# Patient Record
Sex: Male | Born: 1980 | Race: White | Hispanic: No | Marital: Married | Smoking: Current every day smoker
Health system: Southern US, Community
[De-identification: ages and names within clinical notes are randomized; demographics above are authoritative.]

## PROBLEM LIST (undated history)

## (undated) DIAGNOSIS — M109 Gout, unspecified: Secondary | ICD-10-CM

## (undated) DIAGNOSIS — K219 Gastro-esophageal reflux disease without esophagitis: Secondary | ICD-10-CM

---
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
## 2002-08-12 ENCOUNTER — Encounter: Payer: Self-pay | Admitting: Emergency Medicine

## 2002-08-12 ENCOUNTER — Emergency Department (HOSPITAL_COMMUNITY): Admission: EM | Admit: 2002-08-12 | Discharge: 2002-08-12 | Payer: Self-pay | Admitting: Emergency Medicine

## 2003-02-21 ENCOUNTER — Emergency Department (HOSPITAL_COMMUNITY): Admission: EM | Admit: 2003-02-21 | Discharge: 2003-02-21 | Payer: Self-pay | Admitting: *Deleted

## 2003-07-18 ENCOUNTER — Emergency Department (HOSPITAL_COMMUNITY): Admission: EM | Admit: 2003-07-18 | Discharge: 2003-07-18 | Payer: Self-pay

## 2006-01-12 ENCOUNTER — Emergency Department (HOSPITAL_COMMUNITY): Admission: EM | Admit: 2006-01-12 | Discharge: 2006-01-12 | Payer: Self-pay | Admitting: Emergency Medicine
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
## 2010-10-25 ENCOUNTER — Ambulatory Visit: Payer: Self-pay | Admitting: Family Medicine

## 2013-07-12 ENCOUNTER — Encounter (HOSPITAL_COMMUNITY): Payer: Self-pay | Admitting: Emergency Medicine

## 2013-07-12 ENCOUNTER — Emergency Department (HOSPITAL_COMMUNITY)
Admission: EM | Admit: 2013-07-12 | Discharge: 2013-07-12 | Disposition: A | Discharge status: Home / Self Care | Payer: 59 | Attending: Emergency Medicine | Admitting: Emergency Medicine

## 2013-07-12 ENCOUNTER — Emergency Department (INDEPENDENT_AMBULATORY_CARE_PROVIDER_SITE_OTHER)
Admission: EM | Admit: 2013-07-12 | Discharge: 2013-07-12 | Disposition: A | Discharge status: Nursing Home (Includes ICF) | Payer: 59 | Source: Home / Self Care | Attending: Family Medicine | Admitting: Family Medicine

## 2013-07-12 ENCOUNTER — Emergency Department (INDEPENDENT_AMBULATORY_CARE_PROVIDER_SITE_OTHER): Payer: 59

## 2013-07-12 ENCOUNTER — Emergency Department (HOSPITAL_COMMUNITY): Payer: 59

## 2013-07-12 DIAGNOSIS — Z8719 Personal history of other diseases of the digestive system: Secondary | ICD-10-CM | POA: Insufficient documentation

## 2013-07-12 DIAGNOSIS — R0609 Other forms of dyspnea: Secondary | ICD-10-CM

## 2013-07-12 DIAGNOSIS — R0600 Dyspnea, unspecified: Secondary | ICD-10-CM

## 2013-07-12 DIAGNOSIS — IMO0002 Reserved for concepts with insufficient information to code with codable children: Secondary | ICD-10-CM | POA: Insufficient documentation

## 2013-07-12 DIAGNOSIS — F17200 Nicotine dependence, unspecified, uncomplicated: Secondary | ICD-10-CM | POA: Insufficient documentation

## 2013-07-12 DIAGNOSIS — R42 Dizziness and giddiness: Secondary | ICD-10-CM | POA: Insufficient documentation

## 2013-07-12 DIAGNOSIS — R0602 Shortness of breath: Secondary | ICD-10-CM

## 2013-07-12 DIAGNOSIS — J45901 Unspecified asthma with (acute) exacerbation: Secondary | ICD-10-CM | POA: Insufficient documentation

## 2013-07-12 HISTORY — DX: Gastro-esophageal reflux disease without esophagitis: K21.9

## 2013-07-12 LAB — CBC WITH DIFFERENTIAL/PLATELET
Basophils Absolute: 0.1 10*3/uL (ref 0.0–0.1)
Basophils Relative: 0 % (ref 0–1)
Eosinophils Absolute: 0.1 10*3/uL (ref 0.0–0.7)
Eosinophils Relative: 1 % (ref 0–5)
HCT: 42.8 % (ref 39.0–52.0)
MCH: 33.9 pg (ref 26.0–34.0)
MCHC: 36.2 g/dL — ABNORMAL HIGH (ref 30.0–36.0)
Monocytes Absolute: 1 10*3/uL (ref 0.1–1.0)
Neutro Abs: 9.1 10*3/uL — ABNORMAL HIGH (ref 1.7–7.7)
RDW: 12.6 % (ref 11.5–15.5)

## 2013-07-12 LAB — COMPREHENSIVE METABOLIC PANEL
ALT: 24 U/L (ref 0–53)
Alkaline Phosphatase: 58 U/L (ref 39–117)
CO2: 25 mEq/L (ref 19–32)
GFR calc Af Amer: >90 mL/min (ref >90)
Glucose, Bld: 104 mg/dL — ABNORMAL HIGH (ref 70–99)
Potassium: 3.9 mEq/L (ref 3.5–5.1)
Sodium: 138 mEq/L (ref 135–145)
Total Protein: 7.5 g/dL (ref 6.0–8.3)

## 2013-07-12 LAB — D-DIMER, QUANTITATIVE: D-Dimer, Quant: <0.27 ug/mL-FEU (ref 0.00–0.48)

## 2013-07-12 MED ORDER — PREDNISONE 20 MG PO TABS: 40.0000 mg | ORAL_TABLET | Freq: Every day | ORAL | Status: DC

## 2013-07-12 MED ORDER — ALBUTEROL SULFATE (5 MG/ML) 0.5% IN NEBU
2.5000 mg | INHALATION_SOLUTION | Freq: Once | RESPIRATORY_TRACT | Status: AC
  Administered 2013-07-12: 2.5 mg via RESPIRATORY_TRACT
  Filled 2013-07-12: qty 0.5

## 2013-07-12 MED ORDER — PREDNISONE 20 MG PO TABS
60.0000 mg | ORAL_TABLET | Freq: Once | ORAL | Status: AC
  Administered 2013-07-12: 60 mg via ORAL
  Filled 2013-07-12: qty 3

## 2013-07-12 MED ORDER — IPRATROPIUM BROMIDE 0.02 % IN SOLN
0.5000 mg | Freq: Once | RESPIRATORY_TRACT | Status: AC
  Administered 2013-07-12: 0.5 mg via RESPIRATORY_TRACT
  Filled 2013-07-12: qty 2.5

## 2013-07-12 MED ORDER — ALBUTEROL SULFATE HFA 108 (90 BASE) MCG/ACT IN AERS
2.0000 | INHALATION_SPRAY | Freq: Once | RESPIRATORY_TRACT | Status: AC
  Administered 2013-07-12: 2 via RESPIRATORY_TRACT
  Filled 2013-07-12: qty 6.7

## 2013-07-12 NOTE — ED Notes (Signed)
Sob and chest pain x 5 hours ago back of neck hurts and and needle feeling in chest

## 2013-07-12 NOTE — ED Notes (Signed)
Patient ambulatory with pulse ox, lowest reading 96%. Tolerated well. Denies dizziness or sob while abulating.

## 2013-07-12 NOTE — ED Provider Notes (Signed)
CSN: [id number]     Arrival date & time 07/12/13  1000 History   First MD Initiated Contact with Patient 07/12/13 1108     Chief Complaint  Patient presents with  . Shortness of Breath  . Chest Pain   (Consider location/radiation/quality/duration/timing/severity/associated sxs/prior Treatment) HPI Comments: Patient presents from urgent care for shortness of breath since this morning which came on gradually and has been constant and persistent. Patient states symptoms have gradually improved spontaneously since onset. Shortness of breath associated with a pain in his chest that is "needle like"; pain was so severe at onset that patient became anxious, causing him to also become lightheaded. Patient experienced further increased work of breathing with pain. He states deep breathing aggravates his symptoms and he denies alleviating factors. Hx of asthma as a child; no hx of intubations or hospitalizations for asthma. Patient denies associated fevers, jaw pain, nausea, vomiting, abdominal pain, syncope, numbness, extremity weakness. Patient further denies a hx of HTN, ACS, PE/DVT, surgeries or hospitalizations in the past month, and HRT as well as any leg swelling, calf tenderness or claudication. Patient is a 1ppd smoker x 15 years.  Patient is a 32 y.o. male presenting with shortness of breath and chest pain.  Shortness of Breath Associated symptoms: chest pain   Associated symptoms: no fever and no vomiting   Chest Pain Associated symptoms: shortness of breath   Associated symptoms: no fever, no nausea and not vomiting     Past Medical History  Diagnosis Date  . Acid reflux    History reviewed. No pertinent past surgical history. No family history on file. History  Substance Use Topics  . Smoking status: Current Every Day Smoker  . Smokeless tobacco: Not on file  . Alcohol Use: Yes    Review of Systems  Constitutional: Negative for fever.  Eyes: Negative for visual disturbance.   Respiratory: Positive for shortness of breath.   Cardiovascular: Positive for chest pain.  Gastrointestinal: Negative for nausea and vomiting.  Neurological: Positive for light-headedness. Negative for syncope.  All other systems reviewed and are negative.    Allergies  Review of patient's allergies indicates no known allergies.  Home Medications   Current Outpatient Rx  Name  Route  Sig  Dispense  Refill  . predniSONE (DELTASONE) 20 MG tablet   Oral   Take 2 tablets (40 mg total) by mouth daily.   10 tablet   0    BP 120/77  Pulse 88  Temp(Src) 97.9 F (36.6 C) (Oral)  Resp 22  SpO2 96%  Physical Exam  Nursing note and vitals reviewed. Constitutional: He is oriented to person, place, and time. He appears well-developed and well-nourished. No distress.  HENT:  Head: Normocephalic and atraumatic.  Mouth/Throat: Oropharynx is clear and moist. No oropharyngeal exudate.  Airway patent and patient tolerating secretions without difficulty  Eyes: Conjunctivae and EOM are normal. Pupils are equal, round, and reactive to light. No scleral icterus.  Neck: Normal range of motion. Neck supple.  No stridor  Cardiovascular: Normal rate, regular rhythm, normal heart sounds and intact distal pulses.   Pulmonary/Chest: Effort normal and breath sounds normal. No stridor. No respiratory distress. He has no wheezes. He has no rales.  No retractions or accessory muscle use  Abdominal: Soft. He exhibits no distension. There is no tenderness. There is no rebound and no guarding.  Musculoskeletal: Normal range of motion.  Lymphadenopathy:    He has no cervical adenopathy.  Neurological: He is alert and  oriented to person, place, and time.  Skin: Skin is warm and dry. No rash noted. He is not diaphoretic. No erythema. No pallor.  Psychiatric: He has a normal mood and affect. His behavior is normal.    ED Course  Procedures (including critical care time) Labs Review Labs Reviewed   COMPREHENSIVE METABOLIC PANEL - Abnormal; Notable for the following:    Glucose, Bld 104 (*)    All other components within normal limits  CBC WITH DIFFERENTIAL - Abnormal; Notable for the following:    WBC 12.4 (*)    MCHC 36.2 (*)    Neutro Abs 9.1 (*)    All other components within normal limits  D-DIMER, QUANTITATIVE  POCT I-STAT TROPONIN I   Imaging Review Dg Chest 2 View  07/12/2013   *RADIOLOGY REPORT*  Clinical Data: Chest pain and shortness of breath  CHEST - 2 VIEW  Comparison:  July 12, 2013  Findings:  Lungs clear.  Heart size and pulmonary vascularity normal.  No adenopathy.  No pneumothorax.  No bone lesions.  IMPRESSION: No abnormality noted.   Original Report Authenticated By: Bretta Bang, M.D.   Dg Chest 2 View  07/12/2013   *RADIOLOGY REPORT*  Clinical Data: Sudden onset shortness of breath.  CHEST - 2 VIEW  Comparison: None.  Findings: The lungs are clear without focal infiltrate, edema, pneumothorax or pleural effusion. Interstitial markings are diffusely coarsened with chronic features. The cardiopericardial silhouette is within normal limits for size. Imaged bony structures of the thorax are intact.  IMPRESSION: No acute cardiopulmonary process.   Original Report Authenticated By: Kennith Center, M.D.    Date: 07/12/2013  Rate: 76  Rhythm: normal sinus rhythm  QRS Axis: normal  Intervals: normal  ST/T Wave abnormalities: nonspecific ST changes  Conduction Disutrbances:none  Narrative Interpretation: NSR with ? early repolarization; no STEMI  Old EKG Reviewed: none available I have personally reviewed and interpreted this EKG  MDM   1. Shortness of breath    32 year old male with no significant past medical history presents for shortness of breath. Patient states the symptoms have gradually improved since onset spontaneously. On initial presentation patient as well and nontoxic appearing, hemodynamically stable and afebrile. Patient is taking even deep  respirations without retractions or accessory muscle use. He is in no acute respiratory distress. Lungs clear to auscultation bilaterally. Chest x-ray without evidence of pneumonia, pneumothorax, pleural effusion, or infiltrate. EKG unremarkable and troponin 0.00 - doubt ACS in light of reassuring work up, atypical nature of symptoms, and TIMI score of 0. Also doubt PE given lack of tachycardia, tachypnea, dyspnea, or hypoxia; D dimer WNL. Patient given duoneb and prednisone in ED with mild relief of symptoms, though he states symptoms have continued to improve spontaneously. Suspect acute bronchospasm vs anxiety vs reflux. Patient ambulates without hypoxia. Believe patient is appropriate and stable for d/c with PCP follow up for further evaluation of symptoms. Rx for prednisone and an albuterol inhaler provided should symptoms persist. Return precautions advised and patient agreeable to plan with no unaddressed concerns.    Antony Madura, PA-C 07/12/13 1430

## 2013-07-12 NOTE — ED Provider Notes (Signed)
Medical screening examination/treatment/procedure(s) were performed by non-physician practitioner and as supervising physician I was immediately available for consultation/collaboration.   Brayant Dorr W. Hollace Michelli, MD 07/12/13 1639 

## 2013-07-12 NOTE — ED Notes (Signed)
Sob onset approximately 5 hours ago.  Reports worse the last 2 hours.  Patient works night shift.  Patient reports heart burn initially, followed by coughing, then sharp pain in chest. Has continued to have sharp pain in chest .  Patient admits to feeling anxious

## 2013-07-12 NOTE — ED Notes (Signed)
Patient to ED with C/O shortness of breath.  States that when he got up this AM he was having difficulty breathing. He denies asthm or other respiratory problems. Patient is a smoker.

## 2013-07-12 NOTE — ED Provider Notes (Addendum)
CSN: [id number]     Arrival date & time 07/12/13  1308 History   First MD Initiated Contact with Patient 07/12/13 [phone]     Chief Complaint  Patient presents with  . Shortness of Breath   (Consider location/radiation/quality/duration/timing/severity/associated sxs/prior Treatment) Patient is a 32 y.o. male presenting with shortness of breath. The history is provided by the patient.  Shortness of Breath Severity:  Moderate Onset quality:  Sudden Duration:  2 hours Timing:  Constant Progression:  Worsening Chronicity:  New Context comment:  Onset with coughing and heartbirn. Associated symptoms: chest pain and cough   Associated symptoms: no abdominal pain, no fever, no hemoptysis, no sputum production and no wheezing   Risk factors: tobacco use     History reviewed. No pertinent past medical history. History reviewed. No pertinent past surgical history. No family history on file. History  Substance Use Topics  . Smoking status: Current Every Day Smoker  . Smokeless tobacco: Not on file  . Alcohol Use: No    Review of Systems  Constitutional: Negative.  Negative for fever.  Respiratory: Positive for cough and shortness of breath. Negative for hemoptysis, sputum production and wheezing.   Cardiovascular: Positive for chest pain. Negative for palpitations and leg swelling.  Gastrointestinal: Negative for abdominal pain.    Allergies  Review of patient's allergies indicates no known allergies.  Home Medications  No current outpatient prescriptions on file. BP 142/91  Pulse 82  Temp(Src) 98.1 F (36.7 C) (Oral)  Resp 28  SpO2 100% Physical Exam  Nursing note and vitals reviewed. Constitutional: He is oriented to person, place, and time. He appears well-developed and well-nourished.  HENT:  Head: Normocephalic.  Right Ear: External ear normal.  Left Ear: External ear normal.  Mouth/Throat: Oropharynx is clear and moist.  Eyes: Pupils are equal, round, and reactive to  light.  Neck: Normal range of motion. Neck supple.  Cardiovascular: Normal rate, regular rhythm, normal heart sounds and intact distal pulses.   Pulmonary/Chest: He is in respiratory distress. He has no wheezes. He has no rales. He exhibits no tenderness.  Abdominal: Soft. Bowel sounds are normal.  Lymphadenopathy:    He has no cervical adenopathy.  Neurological: He is alert and oriented to person, place, and time.  Skin: Skin is warm and dry.    ED Course  Procedures (including critical care time) Labs Review Labs Reviewed - No data to display Imaging Review Dg Chest 2 View  07/12/2013   *RADIOLOGY REPORT*  Clinical Data: Sudden onset shortness of breath.  CHEST - 2 VIEW  Comparison: None.  Findings: The lungs are clear without focal infiltrate, edema, pneumothorax or pleural effusion. Interstitial markings are diffusely coarsened with chronic features. The cardiopericardial silhouette is within normal limits for size. Imaged bony structures of the thorax are intact.  IMPRESSION: No acute cardiopulmonary process.   Original Report Authenticated By: Kennith Center, M.D.    MDM  Sent for eval of sob, r/o PE, cxr neg., sudden onset of dyspnea and sob in male smoker this am.    Linna Hoff, MD 07/12/13 0945  Linna Hoff, MD 07/12/13 [phone]

## 2016-09-27 ENCOUNTER — Emergency Department (HOSPITAL_COMMUNITY)
Admission: EM | Admit: 2016-09-27 | Discharge: 2016-09-27 | Disposition: A | Discharge status: Home / Self Care | Payer: Self-pay | Attending: Emergency Medicine | Admitting: Emergency Medicine

## 2016-09-27 ENCOUNTER — Encounter (HOSPITAL_COMMUNITY): Payer: Self-pay | Admitting: *Deleted

## 2016-09-27 DIAGNOSIS — T23221A Burn of second degree of single right finger (nail) except thumb, initial encounter: Secondary | ICD-10-CM | POA: Insufficient documentation

## 2016-09-27 DIAGNOSIS — F17200 Nicotine dependence, unspecified, uncomplicated: Secondary | ICD-10-CM | POA: Insufficient documentation

## 2016-09-27 DIAGNOSIS — T23211A Burn of second degree of right thumb (nail), initial encounter: Secondary | ICD-10-CM | POA: Insufficient documentation

## 2016-09-27 DIAGNOSIS — T300 Burn of unspecified body region, unspecified degree: Secondary | ICD-10-CM

## 2016-09-27 DIAGNOSIS — Y939 Activity, unspecified: Secondary | ICD-10-CM | POA: Insufficient documentation

## 2016-09-27 DIAGNOSIS — Y9269 Other specified industrial and construction area as the place of occurrence of the external cause: Secondary | ICD-10-CM | POA: Insufficient documentation

## 2016-09-27 DIAGNOSIS — T23251A Burn of second degree of right palm, initial encounter: Secondary | ICD-10-CM | POA: Insufficient documentation

## 2016-09-27 DIAGNOSIS — W868XXA Exposure to other electric current, initial encounter: Secondary | ICD-10-CM | POA: Insufficient documentation

## 2016-09-27 DIAGNOSIS — Y990 Civilian activity done for income or pay: Secondary | ICD-10-CM | POA: Insufficient documentation

## 2016-09-27 LAB — CK: Total CK: 287 U/L (ref 49–397)

## 2016-09-27 MED ORDER — SILVER SULFADIAZINE 1 % EX CREA
TOPICAL_CREAM | Freq: Once | CUTANEOUS | Status: AC
  Administered 2016-09-27: 18:00:00 via TOPICAL
  Filled 2016-09-27: qty 85

## 2016-09-27 MED ORDER — KETOROLAC TROMETHAMINE 30 MG/ML IJ SOLN
30.0000 mg | Freq: Once | INTRAMUSCULAR | Status: AC
  Administered 2016-09-27: 30 mg via INTRAVENOUS
  Filled 2016-09-27: qty 1

## 2016-09-27 MED ORDER — MORPHINE SULFATE (PF) 4 MG/ML IV SOLN
4.0000 mg | Freq: Once | INTRAVENOUS | Status: AC
  Administered 2016-09-27: 4 mg via INTRAVENOUS
  Filled 2016-09-27: qty 1
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
## 2016-09-27 NOTE — ED Notes (Signed)
Wound cleaning completed right hand soap and water then wound cleanser. Patient tolerated without incident.

## 2016-09-27 NOTE — ED Notes (Signed)
Provider at bedside

## 2022-09-20 ENCOUNTER — Other Ambulatory Visit: Payer: Self-pay

## 2022-09-20 ENCOUNTER — Encounter (HOSPITAL_COMMUNITY): Payer: Self-pay

## 2022-09-20 ENCOUNTER — Emergency Department (HOSPITAL_COMMUNITY)
Admission: EM | Admit: 2022-09-20 | Discharge: 2022-09-20 | Disposition: A | Discharge status: Home / Self Care | Payer: Self-pay | Attending: Emergency Medicine | Admitting: Emergency Medicine

## 2022-09-20 DIAGNOSIS — R197 Diarrhea, unspecified: Secondary | ICD-10-CM | POA: Insufficient documentation

## 2022-09-20 DIAGNOSIS — R1114 Bilious vomiting: Secondary | ICD-10-CM | POA: Insufficient documentation

## 2022-09-20 DIAGNOSIS — R42 Dizziness and giddiness: Secondary | ICD-10-CM | POA: Insufficient documentation

## 2022-09-20 DIAGNOSIS — R109 Unspecified abdominal pain: Secondary | ICD-10-CM | POA: Insufficient documentation

## 2022-09-20 HISTORY — DX: Gout, unspecified: M10.9

## 2022-09-20 LAB — CBC WITH DIFFERENTIAL/PLATELET
Abs Immature Granulocytes: 0.05 10*3/uL (ref 0.00–0.07)
Basophils Absolute: 0 10*3/uL (ref 0.0–0.1)
Basophils Relative: 0 %
Eosinophils Absolute: 0 10*3/uL (ref 0.0–0.5)
Eosinophils Relative: 0 %
HCT: 49 % (ref 39.0–52.0)
Hemoglobin: 16.8 g/dL (ref 13.0–17.0)
Immature Granulocytes: 0 %
Lymphocytes Relative: 13 %
Lymphs Abs: 1.5 10*3/uL (ref 0.7–4.0)
MCH: 30.5 pg (ref 26.0–34.0)
MCHC: 34.3 g/dL (ref 30.0–36.0)
MCV: 89.1 fL (ref 80.0–100.0)
Monocytes Absolute: 1.1 10*3/uL — ABNORMAL HIGH (ref 0.1–1.0)
Monocytes Relative: 10 %
Neutro Abs: 8.8 10*3/uL — ABNORMAL HIGH (ref 1.7–7.7)
Neutrophils Relative %: 77 %
Platelets: 322 10*3/uL (ref 150–400)
RBC: 5.5 MIL/uL (ref 4.22–5.81)
RDW: 12.3 % (ref 11.5–15.5)
WBC: 11.5 10*3/uL — ABNORMAL HIGH (ref 4.0–10.5)
nRBC: 0 % (ref 0.0–0.2)

## 2022-09-20 LAB — CK: Total CK: 221 U/L (ref 49–397)

## 2022-09-20 LAB — URINALYSIS, ROUTINE W REFLEX MICROSCOPIC
Bacteria, UA: NONE SEEN
Bilirubin Urine: NEGATIVE
Glucose, UA: NEGATIVE mg/dL
Hgb urine dipstick: NEGATIVE
Ketones, ur: NEGATIVE mg/dL
Leukocytes,Ua: NEGATIVE
Nitrite: NEGATIVE
Protein, ur: 30 mg/dL — AB
Specific Gravity, Urine: 1.027 (ref 1.005–1.030)
pH: 7 (ref 5.0–8.0)

## 2022-09-20 LAB — MAGNESIUM: Magnesium: 2.4 mg/dL (ref 1.7–2.4)

## 2022-09-20 LAB — COMPREHENSIVE METABOLIC PANEL
ALT: 42 U/L (ref 0–44)
AST: 40 U/L (ref 15–41)
Albumin: 4.5 g/dL (ref 3.5–5.0)
Alkaline Phosphatase: 67 U/L (ref 38–126)
Anion gap: 17 — ABNORMAL HIGH (ref 5–15)
BUN: 22 mg/dL — ABNORMAL HIGH (ref 6–20)
CO2: 28 mmol/L (ref 22–32)
Calcium: 9.9 mg/dL (ref 8.9–10.3)
Chloride: 91 mmol/L — ABNORMAL LOW (ref 98–111)
Creatinine, Ser: 1.08 mg/dL (ref 0.61–1.24)
GFR, Estimated: >60 mL/min (ref >60)
Glucose, Bld: 124 mg/dL — ABNORMAL HIGH (ref 70–99)
Potassium: 2.8 mmol/L — ABNORMAL LOW (ref 3.5–5.1)
Sodium: 136 mmol/L (ref 135–145)
Total Bilirubin: 0.6 mg/dL (ref 0.3–1.2)
Total Protein: 8.3 g/dL — ABNORMAL HIGH (ref 6.5–8.1)

## 2022-09-20 LAB — LIPASE, BLOOD: Lipase: 41 U/L (ref 11–51)

## 2022-09-20 MED ORDER — LACTATED RINGERS IV BOLUS
1000.0000 mL | Freq: Once | INTRAVENOUS | Status: AC
  Administered 2022-09-20: 1000 mL via INTRAVENOUS

## 2022-09-20 MED ORDER — POTASSIUM CHLORIDE 10 MEQ/100ML IV SOLN
10.0000 meq | Freq: Once | INTRAVENOUS | Status: AC
  Administered 2022-09-20: 10 meq via INTRAVENOUS
  Filled 2022-09-20: qty 100

## 2022-09-20 MED ORDER — SODIUM CHLORIDE 0.9 % IV BOLUS: 1000.0000 mL | Freq: Once | INTRAVENOUS | Status: DC

## 2022-09-20 MED ORDER — KETOROLAC TROMETHAMINE 15 MG/ML IJ SOLN: 15.0000 mg | Freq: Once | INTRAMUSCULAR | Status: DC

## 2022-09-20 MED ORDER — ONDANSETRON 4 MG PO TBDP: 4.0000 mg | ORAL_TABLET | Freq: Three times a day (TID) | ORAL | 0 refills | Status: DC | PRN

## 2022-09-20 MED ORDER — BUPRENORPHINE HCL-NALOXONE HCL 8-2 MG SL FILM: 1.0000 | ORAL_FILM | Freq: Every day | SUBLINGUAL | 0 refills | Status: AC

## 2022-09-20 MED ORDER — METOCLOPRAMIDE HCL 5 MG/ML IJ SOLN: 10.0000 mg | Freq: Once | INTRAMUSCULAR | Status: DC

## 2022-09-20 MED ORDER — KETOROLAC TROMETHAMINE 15 MG/ML IJ SOLN
15.0000 mg | Freq: Once | INTRAMUSCULAR | Status: AC
  Administered 2022-09-20: 15 mg via INTRAVENOUS
  Filled 2022-09-20: qty 1

## 2022-09-20 MED ORDER — METOCLOPRAMIDE HCL 5 MG/ML IJ SOLN
10.0000 mg | Freq: Once | INTRAMUSCULAR | Status: AC
  Administered 2022-09-20: 10 mg via INTRAVENOUS
  Filled 2022-09-20: qty 2

## 2022-09-20 NOTE — ED Provider Triage Note (Signed)
Emergency Medicine Provider Triage Evaluation Note  Zachary Sullivan , a 41 y.o. male  was evaluated in triage.  Pt complains of nausea, vomiting x 4 days due to withdrawal. Symptoms associated with body aches, lightheadedness, headache. Had a syncopal event today, but denies worsening headache since the fall/syncope. 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  Prison reported starting meclizine and PO fluids today for withdrawal.   Review of Systems  Positive: As above Negative: As above  Physical Exam  There were no vitals taken for this visit. Gen:   Awake, ill appearing Resp:  Normal effort. Lungs CTAB. MSK:   Moves extremities without difficulty  Other:  Cheilitis  Medical Decision Making  Medically screening exam initiated at 1:16 AM.  Appropriate orders placed.  Darnelle Catalan was informed that the remainder of the evaluation will be completed by another provider, this initial triage assessment does not replace that evaluation, and the importance of remaining in the ED until their evaluation is complete.  Opiate withdrawal with N/V - work up initiated   Antony Madura, PA-C 09/20/22 0126

## 2022-09-20 NOTE — ED Notes (Signed)
Attempted IV with no success. Pt has hx of IV drug use. IV team consult placed. Pending labs and meds.

## 2022-09-20 NOTE — ED Notes (Signed)
Attempt two time for blood draw with no success RN aware.

## 2022-09-20 NOTE — ED Notes (Addendum)
[facility]. Pt has had syncopal episode and vomiting. Pt recently started on protocol for withdrawals that included meclizine.   Pt c/o dizziness, nausea, vomiting and pain all over.   EMS VS 90/ 150/102 HR 88 98% RA Cbg=143

## 2022-09-20 NOTE — ED Provider Notes (Signed)
8:12 PM  Sleeping, no vomiting, hemodynamically unremarkable, urinalysis unremarkable, patient discharged to jail.   Gerhard Munch, MD 09/20/22 2012

## 2022-09-20 NOTE — ED Provider Notes (Signed)
[facility] EMERGENCY DEPARTMENT Provider Note   CSN: [id number] Arrival date & time: 09/20/22  0126     History  Chief Complaint  Patient presents with   Emesis    Zachary Sullivan is a 41 y.o. male.   Emesis Patient presents with nausea vomiting diarrhea.  Has had for the last few days.  Dizziness.  Pain all over.  Has history of heroin use.  Use around 4 days ago.  Is in jail.  Has been on clindamycin and Augmentin for right foot infection.  Reportedly had CT scan done but not have access to this.  Patient states the foot is healing up.  No blood in the emesis.  Has had some abdominal pain.  Blood pressure was low for EMS.  Sent in for difficulty with orals.    Past Medical History:  Diagnosis Date   Acid reflux    Gout   Heroin use  Home Medications Prior to Admission medications   Medication Sig Start Date End Date Taking? Authorizing Provider  Buprenorphine HCl-Naloxone HCl (SUBOXONE) 8-2 MG FILM Place 1 Film under the tongue daily for 5 days. 09/20/22 09/25/22 Yes Gerhard Munch, MD  ondansetron (ZOFRAN-ODT) 4 MG disintegrating tablet Take 1 tablet (4 mg total) by mouth every 8 (eight) hours as needed for nausea or vomiting. 09/20/22  Yes Gerhard Munch, MD  ibuprofen (ADVIL,MOTRIN) 200 MG tablet Take 400 mg by mouth every 6 (six) hours as needed.    [provider]      Allergies    Patient has no known allergies.    Review of Systems   Review of Systems  Gastrointestinal:  Positive for vomiting.    Physical Exam Updated Vital Signs BP (!) 137/97   Pulse (!) 55   Temp 98.6 F (37 C) (Oral)   Resp (!) 30   Ht 6' (1.829 m)   Wt 81.6 kg   SpO2 99%   BMI 24.41 kg/m  Physical Exam Vitals and nursing note reviewed.  HENT:     Head: Normocephalic and atraumatic.  Eyes:     Pupils: Pupils are equal, round, and reactive to light.  Cardiovascular:     Rate and Rhythm: Regular rhythm.  Pulmonary:     Breath sounds: No  wheezing.  Abdominal:     Tenderness: There is no abdominal tenderness.  Musculoskeletal:     Cervical back: Neck supple.  Skin:    General: Skin is warm.     Capillary Refill: Capillary refill takes less than 2 seconds.     Comments: Healing wound right medial ankle without erythema.  Neurological:     Mental Status: He is alert and oriented to person, place, and time.     ED Results / Procedures / Treatments   Labs (all labs ordered are listed, but only abnormal results are displayed) Labs Reviewed  CBC WITH DIFFERENTIAL/PLATELET - Abnormal; Notable for the following components:      Result Value   WBC 11.5 (*)    Neutro Abs 8.8 (*)    Monocytes Absolute 1.1 (*)    All other components within normal limits  COMPREHENSIVE METABOLIC PANEL - Abnormal; Notable for the following components:   Potassium 2.8 (*)    Chloride 91 (*)    Glucose, Bld 124 (*)    BUN 22 (*)    Total Protein 8.3 (*)    Anion gap 17 (*)    All other components within normal limits  URINALYSIS, ROUTINE  W REFLEX MICROSCOPIC - Abnormal; Notable for the following components:   Protein, ur 30 (*)    All other components within normal limits  LIPASE, BLOOD  CK  MAGNESIUM    EKG None  Radiology No results found.  Procedures Procedures    Medications Ordered in ED Medications  lactated ringers bolus 1,000 mL (0 mLs Intravenous Stopped 09/20/22 1614)  potassium chloride 10 mEq in 100 mL IVPB (0 mEq Intravenous Stopped 09/20/22 1614)  ketorolac (TORADOL) 15 MG/ML injection 15 mg (15 mg Intravenous Given 09/20/22 1501)  metoCLOPramide (REGLAN) injection 10 mg (10 mg Intravenous Given 09/20/22 1504)  lactated ringers bolus 1,000 mL (0 mLs Intravenous Stopped 09/20/22 1752)    ED Course/ Medical Decision Making/ A&P                           Medical Decision Making Amount and/or Complexity of Data Reviewed Labs: ordered.  Risk Prescription drug management.   Patient presents with nausea  vomiting diarrhea.  Had a syncopal episode.  Has been treated for opiate withdrawal.  Last use around 4 days ago.  Decreased oral intake.  Feeling bad.  States he feels very dehydrated.  Healing wounds and is on antibiotics.  initial tachycardia initial tachycardia.  We will give fluid bolus..  Does have a hypokalemia.  Will supplement.  I think most likely secondary to withdrawal.  Benign exam.  If tolerates well and tolerates orals hopefully should be able to [facility] back to [facility].       Final Clinical Impression(s) / ED Diagnoses Final diagnoses:  Bilious vomiting with nausea    Rx / DC Orders ED Discharge Orders          Ordered    ondansetron (ZOFRAN-ODT) 4 MG disintegrating tablet  Every 8 hours PRN        09/20/22 2012    Buprenorphine HCl-Naloxone HCl (SUBOXONE) 8-2 MG FILM  Daily        09/20/22 2030              Benjiman Core, MD 09/24/22 1447

## 2022-09-20 NOTE — Discharge Instructions (Addendum)
Be sure to take all of your medication as previously prescribed and use the provided Zofran for additional relief from nausea.  Return here for concerning changes in your condition.

## 2022-09-20 NOTE — ED Notes (Signed)
Pt called multiple times for recheck vitals, no answer, not seen in lobby

## 2023-04-11 ENCOUNTER — Emergency Department (HOSPITAL_COMMUNITY)

## 2023-04-11 ENCOUNTER — Encounter (HOSPITAL_COMMUNITY): Payer: Self-pay

## 2023-04-11 ENCOUNTER — Other Ambulatory Visit: Payer: Self-pay

## 2023-04-11 ENCOUNTER — Inpatient Hospital Stay (HOSPITAL_COMMUNITY)
Admission: EM | Admit: 2023-04-11 | Discharge: 2023-04-18 | DRG: 603 | Attending: Internal Medicine | Admitting: Internal Medicine

## 2023-04-11 DIAGNOSIS — R519 Headache, unspecified: Secondary | ICD-10-CM | POA: Diagnosis present

## 2023-04-11 DIAGNOSIS — F17210 Nicotine dependence, cigarettes, uncomplicated: Secondary | ICD-10-CM | POA: Diagnosis present

## 2023-04-11 DIAGNOSIS — H538 Other visual disturbances: Secondary | ICD-10-CM | POA: Diagnosis present

## 2023-04-11 DIAGNOSIS — K219 Gastro-esophageal reflux disease without esophagitis: Secondary | ICD-10-CM | POA: Diagnosis present

## 2023-04-11 DIAGNOSIS — Z23 Encounter for immunization: Secondary | ICD-10-CM | POA: Diagnosis not present

## 2023-04-11 DIAGNOSIS — L03213 Periorbital cellulitis: Principal | ICD-10-CM | POA: Diagnosis present

## 2023-04-11 DIAGNOSIS — H5711 Ocular pain, right eye: Secondary | ICD-10-CM | POA: Diagnosis present

## 2023-04-11 LAB — CBC WITH DIFFERENTIAL/PLATELET
Abs Immature Granulocytes: 0.02 10*3/uL (ref 0.00–0.07)
Basophils Absolute: 0.1 10*3/uL (ref 0.0–0.1)
Basophils Relative: 1 %
Eosinophils Absolute: 0.2 10*3/uL (ref 0.0–0.5)
Eosinophils Relative: 3 %
HCT: 44.7 % (ref 39.0–52.0)
Hemoglobin: 15 g/dL (ref 13.0–17.0)
Immature Granulocytes: 0 %
Lymphocytes Relative: 23 %
Lymphs Abs: 1.8 10*3/uL (ref 0.7–4.0)
MCH: 31.5 pg (ref 26.0–34.0)
MCHC: 33.6 g/dL (ref 30.0–36.0)
MCV: 93.9 fL (ref 80.0–100.0)
Monocytes Absolute: 0.6 10*3/uL (ref 0.1–1.0)
Monocytes Relative: 8 %
Neutro Abs: 5.2 10*3/uL (ref 1.7–7.7)
Neutrophils Relative %: 65 %
Platelets: 249 10*3/uL (ref 150–400)
RBC: 4.76 MIL/uL (ref 4.22–5.81)
RDW: 13.2 % (ref 11.5–15.5)
WBC: 8 10*3/uL (ref 4.0–10.5)
nRBC: 0 % (ref 0.0–0.2)

## 2023-04-11 LAB — BASIC METABOLIC PANEL
Anion gap: 11 (ref 5–15)
BUN: 16 mg/dL (ref 6–20)
CO2: 26 mmol/L (ref 22–32)
Calcium: 9.4 mg/dL (ref 8.9–10.3)
Chloride: 102 mmol/L (ref 98–111)
Creatinine, Ser: 0.8 mg/dL (ref 0.61–1.24)
GFR, Estimated: >60 mL/min (ref >60)
Glucose, Bld: 92 mg/dL (ref 70–99)
Potassium: 3.7 mmol/L (ref 3.5–5.1)
Sodium: 139 mmol/L (ref 135–145)

## 2023-04-11 MED ORDER — VANCOMYCIN HCL 1750 MG/350ML IV SOLN
1750.0000 mg | Freq: Once | INTRAVENOUS | Status: AC
  Administered 2023-04-11: 1750 mg via INTRAVENOUS
  Filled 2023-04-11: qty 350

## 2023-04-11 MED ORDER — KETOROLAC TROMETHAMINE 15 MG/ML IJ SOLN
15.0000 mg | Freq: Four times a day (QID) | INTRAMUSCULAR | Status: AC | PRN
  Administered 2023-04-11 – 2023-04-14 (×10): 15 mg via INTRAVENOUS
  Filled 2023-04-11 (×10): qty 1

## 2023-04-11 MED ORDER — ENOXAPARIN SODIUM 40 MG/0.4ML IJ SOSY
40.0000 mg | PREFILLED_SYRINGE | INTRAMUSCULAR | Status: DC
  Administered 2023-04-11 – 2023-04-17 (×7): 40 mg via SUBCUTANEOUS
  Filled 2023-04-11 (×7): qty 0.4

## 2023-04-11 MED ORDER — PIPERACILLIN-TAZOBACTAM 3.375 G IVPB 30 MIN
3.3750 g | Freq: Once | INTRAVENOUS | Status: AC
  Administered 2023-04-11: 3.375 g via INTRAVENOUS
  Filled 2023-04-11 (×2): qty 50

## 2023-04-11 MED ORDER — PIPERACILLIN-TAZOBACTAM 3.375 G IVPB
3.3750 g | Freq: Three times a day (TID) | INTRAVENOUS | Status: DC
  Administered 2023-04-12 – 2023-04-18 (×19): 3.375 g via INTRAVENOUS
  Filled 2023-04-11 (×19): qty 50

## 2023-04-11 MED ORDER — ONDANSETRON HCL 4 MG PO TABS: 4.0000 mg | ORAL_TABLET | Freq: Four times a day (QID) | ORAL | Status: DC | PRN

## 2023-04-11 MED ORDER — TETANUS-DIPHTH-ACELL PERTUSSIS 5-2.5-18.5 LF-MCG/0.5 IM SUSY
0.5000 mL | PREFILLED_SYRINGE | Freq: Once | INTRAMUSCULAR | Status: AC
  Administered 2023-04-11: 0.5 mL via INTRAMUSCULAR
  Filled 2023-04-11: qty 0.5

## 2023-04-11 MED ORDER — IOHEXOL 300 MG/ML  SOLN
75.0000 mL | Freq: Once | INTRAMUSCULAR | Status: AC | PRN
  Administered 2023-04-11: 75 mL via INTRAVENOUS

## 2023-04-11 MED ORDER — VANCOMYCIN HCL 1750 MG/350ML IV SOLN
1750.0000 mg | Freq: Two times a day (BID) | INTRAVENOUS | Status: DC
  Administered 2023-04-12: 1750 mg via INTRAVENOUS
  Filled 2023-04-11: qty 350

## 2023-04-11 MED ORDER — PIPERACILLIN-TAZOBACTAM 3.375 G IVPB: 3.3750 g | Freq: Three times a day (TID) | INTRAVENOUS | Status: DC

## 2023-04-11 MED ORDER — ONDANSETRON HCL 4 MG/2ML IJ SOLN: 4.0000 mg | Freq: Four times a day (QID) | INTRAMUSCULAR | Status: DC | PRN

## 2023-04-11 MED ORDER — SODIUM CHLORIDE 0.9 % IV SOLN: INTRAVENOUS | Status: DC

## 2023-04-11 NOTE — ED Provider Notes (Signed)
[place] [facility] AT [facility] Provider Note   CSN: [id number] Arrival date & time: 04/11/23  1546     History  Chief Complaint  Patient presents with   Facial Swelling    right eye pain/swelling that started friday    MCADOO LIVERPOOL is a 42 y.o. male who presents today left facial swelling for 4 days. Patient states he woke up with facial pain that progressively started to swell. Patient believes it was a spider bite, but did not see a spider in his cell. Patient endorses headache, pain with right eye movement, chills, watery eyes, and right unilateral blurry vision. Patient states he has a "high pain tolerance" and rates his HA and face pain a 2/10. Patient states he noticed "clear liquid" from the site yesterday. Patient denies body aches, sore throat, ear pain, N/V, SOB. Patient states he is unsure of his tetanus status. No antibiotics. States unable to get jail staff to bring him to ED for several days until getting his attorney involved and is concerned about receiving medical treatment if returned to facility. 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      Home Medications Prior to Admission medications   Medication Sig Start Date End Date Taking? Authorizing Provider  ibuprofen (ADVIL,MOTRIN) 200 MG tablet Take 400 mg by mouth every 6 (six) hours as needed.    [provider]  ondansetron (ZOFRAN-ODT) 4 MG disintegrating tablet Take 1 tablet (4 mg total) by mouth every 8 (eight) hours as needed for nausea or vomiting. 09/20/22   Gerhard Munch, MD      Allergies    Patient has no known allergies.    Review of Systems   Review of Systems  All other systems reviewed and are negative.   Physical Exam Updated Vital Signs BP (!) 134/92   Pulse 80   Temp 98 F (36.7 C) (Oral)   Resp 16   Ht 5\' 11"  (1.803 m)   Wt 81.6 kg   SpO2 98%   BMI 25.09 kg/m  Physical Exam Vitals and nursing note reviewed.  Constitutional:      General: He is  not in acute distress.    Appearance: Normal appearance. He is not toxic-appearing.  HENT:     Head:     Comments: Right cheek with pinpoint area of fluctuance, no active drainage, surrounding erythema and induration up to right lower eyelid which is mildly swollen, patient endorses pain with extraocular movements    Mouth/Throat:     Mouth: Mucous membranes are moist.  Eyes:     Extraocular Movements: Extraocular movements intact.     Conjunctiva/sclera: Conjunctivae normal.     Pupils: Pupils are equal, round, and reactive to light.  Cardiovascular:     Rate and Rhythm: Normal rate and regular rhythm.     Heart sounds: No murmur heard. Pulmonary:     Effort: Pulmonary effort is normal.     Breath sounds: Normal breath sounds.  Abdominal:     General: Abdomen is flat.     Palpations: Abdomen is soft.     Tenderness: There is no abdominal tenderness.  Musculoskeletal:        General: Normal range of motion.     Cervical back: Normal range of motion and neck supple. No rigidity.     Right lower leg: No edema.     Left lower leg: No edema.  Skin:    General: Skin is warm and dry.  Capillary Refill: Capillary refill takes less than 2 seconds.  Neurological:     General: No focal deficit present.     Mental Status: He is alert and oriented to person, place, and time.  Psychiatric:        Behavior: Behavior normal.     ED Results / Procedures / Treatments   Labs (all labs ordered are listed, but only abnormal results are displayed) Labs Reviewed  CBC WITH DIFFERENTIAL/PLATELET  BASIC METABOLIC PANEL    EKG None  Radiology CT Maxillofacial W Contrast  Result Date: 04/11/2023 CLINICAL DATA:  right facial celluitis extending to eye EXAM: CT MAXILLOFACIAL WITH CONTRAST TECHNIQUE: Multidetector CT imaging of the maxillofacial structures was performed with intravenous contrast. Multiplanar CT image reconstructions were also generated. RADIATION DOSE REDUCTION: This exam was  performed according to the departmental dose-optimization program which includes automated exposure control, adjustment of the mA and/or kV according to patient size and/or use of iterative reconstruction technique. CONTRAST:  75mL OMNIPAQUE IOHEXOL 300 MG/ML  SOLN COMPARISON:  None Available. FINDINGS: Osseous: No fracture or mandibular dislocation. No destructive process. Orbits: Negative. No traumatic or inflammatory finding. Sinuses: No middle ear or mastoid effusion. Impacted cerumen in the left EAC. Paranasal sinuses are clear. Soft tissues: Soft tissue stranding in the infraorbital and pre maxillary soft tissues on the right. No evidence of a drainable fluid collection. There is a small hypodense region in the subcutaneous soft tissues measuring approximately 4 x 6 mm, which could represent a small abscess (series 3, image 61). Limited intracranial: No significant or unexpected finding. IMPRESSION: Soft tissue stranding in the infraorbital and pre maxillary soft tissues on the right, consistent with preseptal cellulitis. There is a small hypodense region in the subcutaneous soft tissues measuring approximately 4 x 6 mm, which could represent a small abscess. No evidence of postseptal inflammatory changes to suggest orbital cellulitis. Electronically Signed   By: Lorenza Cambridge M.D.   On: 04/11/2023 19:33    Procedures Procedures    Medications Ordered in ED Medications  Tdap (BOOSTRIX) injection 0.5 mL (0.5 mLs Intramuscular Given 04/11/23 1759)  iohexol (OMNIPAQUE) 300 MG/ML solution 75 mL (75 mLs Intravenous Contrast Given 04/11/23 1845)    ED Course/ Medical Decision Making/ A&P                             Medical Decision Making Amount and/or Complexity of Data Reviewed Labs: ordered. Decision-making details documented in ED Course. Radiology: ordered. Decision-making details documented in ED Course.  Risk Decision regarding hospitalization.   Medical Decision Making:   RONN OSANTOWSKI is a 42 y.o. male who presented to the ED today with insect bite detailed above.    Patient's presentation is complicated by their history of currently in jail, IVDU.  Complete initial physical exam performed, notably the patient was in NAD. Nontoxic appearing. Right cheek with pinpoint area of fluctuance, no active drainage, surrounding erythema and induration up to right lower eyelid which is mildly swollen, patient endorses pain with extraocular movements.    Reviewed and confirmed nursing documentation for past medical history, family history, social history.    Initial Assessment:   With the patient's presentation, differential diagnosis includes but is not limited to insect/spider bite, cellulitis, abscess, sepsis, preseptal vs orbital cellulitis.  This is most consistent with an acute complicated illness  Initial Plan:  Screening labs including CBC and Metabolic panel to evaluate for infectious or metabolic etiology  of disease.  Tdap updated CT MXF to assess for preseptal/orbital cellulitis Objective evaluation as below reviewed   Initial Study Results:   Laboratory  All laboratory results reviewed without evidence of clinically relevant pathology.    Radiology:  All images reviewed independently. Agree with radiology report at this time.   CT Maxillofacial W Contrast  Result Date: 04/11/2023 CLINICAL DATA:  right facial celluitis extending to eye EXAM: CT MAXILLOFACIAL WITH CONTRAST TECHNIQUE: Multidetector CT imaging of the maxillofacial structures was performed with intravenous contrast. Multiplanar CT image reconstructions were also generated. RADIATION DOSE REDUCTION: This exam was performed according to the departmental dose-optimization program which includes automated exposure control, adjustment of the mA and/or kV according to patient size and/or use of iterative reconstruction technique. CONTRAST:  75mL OMNIPAQUE IOHEXOL 300 MG/ML  SOLN COMPARISON:  None Available.  FINDINGS: Osseous: No fracture or mandibular dislocation. No destructive process. Orbits: Negative. No traumatic or inflammatory finding. Sinuses: No middle ear or mastoid effusion. Impacted cerumen in the left EAC. Paranasal sinuses are clear. Soft tissues: Soft tissue stranding in the infraorbital and pre maxillary soft tissues on the right. No evidence of a drainable fluid collection. There is a small hypodense region in the subcutaneous soft tissues measuring approximately 4 x 6 mm, which could represent a small abscess (series 3, image 61). Limited intracranial: No significant or unexpected finding. IMPRESSION: Soft tissue stranding in the infraorbital and pre maxillary soft tissues on the right, consistent with preseptal cellulitis. There is a small hypodense region in the subcutaneous soft tissues measuring approximately 4 x 6 mm, which could represent a small abscess. No evidence of postseptal inflammatory changes to suggest orbital cellulitis. Electronically Signed   By: Lorenza Cambridge M.D.   On: 04/11/2023 19:33      Final Assessment and Plan:   42 year old male presents to the ED with concern for insect bite to the right cheek.  States that symptoms have been ongoing for several days and unable to get child staff to bring him for evaluation.  No recent antibiotics.  History of IV drug use with last use in February 2024.  Tetanus updated today.  Workup initiated as above for further assessment.  On exam, patient does have a pinpoint area of fluctuance to the right cheek but does have erythematous changes extending up to the right lower eyelid with swelling.  Endorses pain with extraocular movements and slightly blurred vision on the right that seems to be improving.  Neurologically intact.  CT with evidence of preseptal cellulitis.  Discussed with patient and guardian at bedside who stated that it is unlikely patient will get good care at the jail including timely antibiotics and reevaluation as needed.   With this, consulted hospitalist for admission.  Reassuring lab work.  Patient afebrile and nontoxic-appearing.  With anticipated poor outpatient care, will admit for further management.  IV antibiotics ordered.  Patient agreeable with plan.  Stable at time of admission.   Clinical Impression:  1. Preseptal cellulitis of left eye      Admit           Final Clinical Impression(s) / ED Diagnoses Final diagnoses:  Preseptal cellulitis of left eye    Rx / DC Orders ED Discharge Orders     None         Richardson Dopp 04/11/23 2112    Arby Barrette, MD 04/12/23 1706

## 2023-04-11 NOTE — Progress Notes (Signed)
Pharmacy Antibiotic Note  Zachary Sullivan is a 42 y.o. male admitted on 04/11/2023 with cellulitis, right cheek up to right lower eyelid.  Pharmacy has been consulted for Zosyn and vancomycin dosing.  In ED, Zosyn 3.375 g IV x 1 and vancomycin 1750 mg IV x 1   Plan: Continue Zosyn 3.375g IV q8h (4 hour infusion). Continue vancomycin 1750 mg IV every 12 hours (Goal AUC 400-550, eAUC 527.6, SCr used: 0.8) Monitor clinical progress, renal function, vancomycin levels as indicated F/U C&S, abx deescalation / LOT   Height: 5\' 11"  (180.3 cm) Weight: 81.6 kg (179 lb 14.3 oz) IBW/kg (Calculated) : 75.3  Temp (24hrs), Avg:98 F (36.7 C), Min:98 F (36.7 C), Max:98 F (36.7 C)  Recent Labs  Lab 04/11/23 1800  WBC 8.0  CREATININE 0.80    Estimated Creatinine Clearance: 130.7 mL/min (by C-G formula based on SCr of 0.8 mg/dL).    No Known Allergies  Antimicrobials this admission: 6/4 Zosyn >>  6/4 vancomycin >>     Thank you for allowing pharmacy to be a part of this patient's care.  Lynden Ang, PharmD, BCPS 04/11/2023 9:15 PM

## 2023-04-11 NOTE — Progress Notes (Signed)
A consult was received from an ED physician for Zosyn and vancomycin per pharmacy dosing.  The patient's profile has been reviewed for ht/wt/allergies/indication/available labs.    A one time order has been placed for Zosyn 3.375 g IV over 30 min and vancomycin 1750 mg IV.    Further antibiotics/pharmacy consults should be ordered by admitting physician if indicated.                       Thank you, Lynden Ang, PharmD, BCPS 04/11/2023  8:51 PM

## 2023-04-11 NOTE — ED Notes (Signed)
Patient arrived with police, in custody. Patient reports right eye pain/swelling that started Friday. Believes possible spider bite. AAOX4, respirations even and unlabored. NAD. Denies any other symptoms at this time

## 2023-04-11 NOTE — ED Notes (Signed)
ED TO INPATIENT HANDOFF REPORT  ED Nurse Name and Phone #: Thamas Jaegers Name/Age/Gender Zachary Sullivan 42 y.o. male Room/Bed: WTR9/WTR9  Code Status   Code Status: Not on file  Home/SNF/Other [facility] Patient oriented to: self, place, time, and situation Is this baseline? Yes   Triage Complete: Triage complete  Chief Complaint Preseptal cellulitis [L03.213]  Triage Note No notes on file   Allergies No Known Allergies  Level of Care/Admitting Diagnosis ED Disposition     ED Disposition  Admit   Condition  --   Comment  Hospital Area: [facility] [[id number]]  Level of Care: Med-Surg [16]  May admit patient to [facility] or [facility] if equivalent level of care is available:: Yes  Covid Evaluation: Asymptomatic - no recent exposure (last 10 days) testing not required  Diagnosis: Preseptal cellulitis [161096]  Admitting Physician: Rometta Emery [[id number]]  Attending Physician: Rometta Emery [[id number]]  Certification:: I certify this patient will need inpatient services for at least 2 midnights  Estimated Length of Stay: 2          B Medical/Surgery History Past Medical History:  Diagnosis Date   Acid reflux    Gout    History reviewed. No pertinent surgical history.   A IV Location/Drains/Wounds Patient Lines/Drains/Airways Status     Active Line/Drains/Airways     Name Placement date Placement time Site Days   Peripheral IV 04/11/23 20 G 1" Left;Posterior Forearm 04/11/23  1759  Forearm  less than 1            Intake/Output Last 24 hours No intake or output data in the 24 hours ending 04/11/23 2041  Labs/Imaging Results for orders placed or performed during the hospital encounter of 04/11/23 (from the past 48 hour(s))  CBC with Differential     Status: None   Collection Time: 04/11/23  6:00 PM  Result Value Ref Range   WBC 8.0 4.0 - 10.5 K/uL   RBC 4.76 4.22 - 5.81 MIL/uL   Hemoglobin 15.0 13.0 - 17.0 g/dL   HCT 04.5 40.9  - 81.1 %   MCV 93.9 80.0 - 100.0 fL   MCH 31.5 26.0 - 34.0 pg   MCHC 33.6 30.0 - 36.0 g/dL   RDW 91.4 78.2 - 95.6 %   Platelets 249 150 - 400 K/uL   nRBC 0.0 0.0 - 0.2 %   Neutrophils Relative % 65 %   Neutro Abs 5.2 1.7 - 7.7 K/uL   Lymphocytes Relative 23 %   Lymphs Abs 1.8 0.7 - 4.0 K/uL   Monocytes Relative 8 %   Monocytes Absolute 0.6 0.1 - 1.0 K/uL   Eosinophils Relative 3 %   Eosinophils Absolute 0.2 0.0 - 0.5 K/uL   Basophils Relative 1 %   Basophils Absolute 0.1 0.0 - 0.1 K/uL   Immature Granulocytes 0 %   Abs Immature Granulocytes 0.02 0.00 - 0.07 K/uL    Comment: Performed at [facility], [address] [place], [place] [address]  Basic metabolic panel     Status: None   Collection Time: 04/11/23  6:00 PM  Result Value Ref Range   Sodium 139 135 - 145 mmol/L   Potassium 3.7 3.5 - 5.1 mmol/L   Chloride 102 98 - 111 mmol/L   CO2 26 22 - 32 mmol/L   Glucose, Bld 92 70 - 99 mg/dL    Comment: Glucose reference range applies only to samples taken after fasting for  at least 8 hours.   BUN 16 6 - 20 mg/dL   Creatinine, Ser 4.54 0.61 - 1.24 mg/dL   Calcium 9.4 8.9 - 09.8 mg/dL   GFR, Estimated >11 >91 mL/min    Comment: (NOTE) Calculated using the CKD-EPI Creatinine Equation (2021)    Anion gap 11 5 - 15    Comment: Performed at [facility], [address] [place], [place] [address]   CT Maxillofacial W Contrast  Result Date: 04/11/2023 CLINICAL DATA:  right facial celluitis extending to eye EXAM: CT MAXILLOFACIAL WITH CONTRAST TECHNIQUE: Multidetector CT imaging of the maxillofacial structures was performed with intravenous contrast. Multiplanar CT image reconstructions were also generated. RADIATION DOSE REDUCTION: This exam was performed according to the departmental dose-optimization program which includes automated exposure control, adjustment of the mA and/or kV according to patient size and/or use of iterative reconstruction  technique. CONTRAST:  75mL OMNIPAQUE IOHEXOL 300 MG/ML  SOLN COMPARISON:  None Available. FINDINGS: Osseous: No fracture or mandibular dislocation. No destructive process. Orbits: Negative. No traumatic or inflammatory finding. Sinuses: No middle ear or mastoid effusion. Impacted cerumen in the left EAC. Paranasal sinuses are clear. Soft tissues: Soft tissue stranding in the infraorbital and pre maxillary soft tissues on the right. No evidence of a drainable fluid collection. There is a small hypodense region in the subcutaneous soft tissues measuring approximately 4 x 6 mm, which could represent a small abscess (series 3, image 61). Limited intracranial: No significant or unexpected finding. IMPRESSION: Soft tissue stranding in the infraorbital and pre maxillary soft tissues on the right, consistent with preseptal cellulitis. There is a small hypodense region in the subcutaneous soft tissues measuring approximately 4 x 6 mm, which could represent a small abscess. No evidence of postseptal inflammatory changes to suggest orbital cellulitis. Electronically Signed   By: Lorenza Cambridge M.D.   On: 04/11/2023 19:33    Pending Labs Unresulted Labs (From admission, onward)    None       Vitals/Pain Today's Vitals   04/11/23 1559 04/11/23 1615 04/11/23 1950 04/11/23 1954  BP:  131/83 (!) 134/92   Pulse:  82 80   Resp:  16 16   Temp:    98 F (36.7 C)  TempSrc:    Oral  SpO2:  99% 98%   Weight: 81.6 kg     Height: 5\' 11"  (1.803 m)     PainSc: 5        Isolation Precautions No active isolations  Medications Medications  Tdap (BOOSTRIX) injection 0.5 mL (0.5 mLs Intramuscular Given 04/11/23 1759)  iohexol (OMNIPAQUE) 300 MG/ML solution 75 mL (75 mLs Intravenous Contrast Given 04/11/23 1845)    Mobility walks     Focused Assessments     R Recommendations: See Admitting Provider Note  Report given to:   Additional Notes:

## 2023-04-11 NOTE — H&P (Signed)
History and Physical    Patient: Zachary Sullivan GNF:[id number] DOB: February 21, 1981 DOA: 04/11/2023 DOS: the patient was seen and examined on 04/11/2023 PCP: Patient, No Pcp Per  Patient coming from:  [place]  Chief Complaint:  Chief Complaint  Patient presents with   Facial Swelling    right eye pain/swelling that started friday   HPI: Zachary Sullivan is a 42 y.o. male with medical history significant of gout, GERD, tobacco abuse, who is currently incarcerated brought in today secondary to right facial swelling.  Patient reported suspected insect bite.  It happened around the right cheekbone.  This happened a few days ago.  Since then the area has been painful since and gradually swollen and red.  Also tender and warm.  Patient has an expanding cellulitis with involvement of the lower eyelid.  At this point suspected to have preseptal cellulitis.  Patient is worried that his cellulitis may not be adequately treated in the jail.  Due to the facial nature of his infection we will admit the patient for IV antibiotics hopefully transition to oral antibiotics soon.  Review of Systems: As mentioned in the history of present illness. All other systems reviewed and are negative. Past Medical History:  Diagnosis Date   Acid reflux    Gout    History reviewed. No pertinent surgical history. Social History:  reports that he has been smoking cigarettes. He has been smoking an average of 1 pack per day. He has never used smokeless tobacco. He reports current alcohol use. He reports that he does not currently use drugs.  No Known Allergies  History reviewed. No pertinent family history.  Prior to Admission medications   Medication Sig Start Date End Date Taking? Authorizing Provider  ibuprofen (ADVIL,MOTRIN) 200 MG tablet Take 400 mg by mouth every 6 (six) hours as needed.    [provider]  ondansetron (ZOFRAN-ODT) 4 MG disintegrating tablet Take 1 tablet (4 mg total) by mouth every 8 (eight)  hours as needed for nausea or vomiting. 09/20/22   Gerhard Munch, MD    Physical Exam: Vitals:   04/11/23 1615 04/11/23 1950 04/11/23 1954 04/11/23 2000  BP: 131/83 (!) 134/92  (!) 141/84  Pulse: 82 80  84  Resp: 16 16  16   Temp:   98 F (36.7 C)   TempSrc:   Oral   SpO2: 99% 98%  99%  Weight:      Height:       Constitutional: NAD, calm, comfortable Eyes: PERRL, lids and conjunctivae normal, right preseptal cellulitis ENMT: Mucous membranes are moist. Posterior pharynx clear of any exudate or lesions.Normal dentition.  Neck: normal, supple, no masses, no thyromegaly Respiratory: clear to auscultation bilaterally, no wheezing, no crackles. Normal respiratory effort. No accessory muscle use.  Cardiovascular: Regular rate and rhythm, no murmurs / rubs / gallops. No extremity edema. 2+ pedal pulses. No carotid bruits.  Abdomen: no tenderness, no masses palpated. No hepatosplenomegaly. Bowel sounds positive.  Musculoskeletal: Good range of motion, no joint swelling or tenderness, Skin: no rashes, lesions, ulcers. No induration Neurologic: CN 2-12 grossly intact. Sensation intact, DTR normal. Strength 5/5 in all 4.  Psychiatric: Normal judgment and insight. Alert and oriented x 3. Normal mood  Data Reviewed:  CBC and chemistry entirely within normal.  Urinalysis within normal.  CT maxillofacial showed soft tissue stranding in the infraorbital and premaxillary soft tissues on the right consistent with preseptal cellulitis.  A small area of hypodense region in the subcutaneous soft tissues  measuring 4 x 6 mm probably a small abscess no postseptal inflammation changes  Assessment and Plan:  #1 right preseptal cellulitis: Patient will be admitted.  Initiate IV vancomycin and Zosyn.  Blood cultures obtained.  Monitor his face with the swelling going down.  Once infection seems to be under control transition to oral antibiotics to complete treatment.  #2 history of GERD: PPIs as  needed.  #3 history of gout: No evidence of gouty attacks.    Advance Care Planning:   Code Status: Not on file full code  Consults: None  Family Communication: No family at bedside  Severity of Illness: The appropriate patient status for this patient is INPATIENT. Inpatient status is judged to be reasonable and necessary in order to provide the required intensity of service to ensure the patient's safety. The patient's presenting symptoms, physical exam findings, and initial radiographic and laboratory data in the context of their chronic comorbidities is felt to place them at high risk for further clinical deterioration. Furthermore, it is not anticipated that the patient will be medically stable for discharge from the hospital within 2 midnights of admission.   * I certify that at the point of admission it is my clinical judgment that the patient will require inpatient hospital care spanning beyond 2 midnights from the point of admission due to high intensity of service, high risk for further deterioration and high frequency of surveillance required.*  AuthorLonia Blood, MD 04/11/2023 9:11 PM  For on call review [email or web address].

## 2023-04-12 DIAGNOSIS — L03213 Periorbital cellulitis: Secondary | ICD-10-CM | POA: Diagnosis not present

## 2023-04-12 LAB — COMPREHENSIVE METABOLIC PANEL
ALT: 25 U/L (ref 0–44)
AST: 26 U/L (ref 15–41)
Albumin: 3.5 g/dL (ref 3.5–5.0)
Alkaline Phosphatase: 56 U/L (ref 38–126)
Anion gap: 11 (ref 5–15)
BUN: 18 mg/dL (ref 6–20)
CO2: 23 mmol/L (ref 22–32)
Calcium: 8.8 mg/dL — ABNORMAL LOW (ref 8.9–10.3)
Chloride: 104 mmol/L (ref 98–111)
Creatinine, Ser: 0.96 mg/dL (ref 0.61–1.24)
GFR, Estimated: >60 mL/min (ref >60)
Glucose, Bld: 170 mg/dL — ABNORMAL HIGH (ref 70–99)
Potassium: 3.7 mmol/L (ref 3.5–5.1)
Sodium: 138 mmol/L (ref 135–145)
Total Bilirubin: 0.7 mg/dL (ref 0.3–1.2)
Total Protein: 6.9 g/dL (ref 6.5–8.1)

## 2023-04-12 LAB — CBC
HCT: 38 % — ABNORMAL LOW (ref 39.0–52.0)
Hemoglobin: 13.2 g/dL (ref 13.0–17.0)
MCH: 32 pg (ref 26.0–34.0)
MCHC: 34.7 g/dL (ref 30.0–36.0)
MCV: 92.2 fL (ref 80.0–100.0)
Platelets: 216 10*3/uL (ref 150–400)
RBC: 4.12 MIL/uL — ABNORMAL LOW (ref 4.22–5.81)
RDW: 13.1 % (ref 11.5–15.5)
WBC: 7.5 10*3/uL (ref 4.0–10.5)
nRBC: 0 % (ref 0.0–0.2)

## 2023-04-12 MED ORDER — VANCOMYCIN HCL 1250 MG/250ML IV SOLN
1250.0000 mg | Freq: Two times a day (BID) | INTRAVENOUS | Status: DC
  Administered 2023-04-13 (×3): 1250 mg via INTRAVENOUS
  Filled 2023-04-12 (×3): qty 250

## 2023-04-12 MED ORDER — ACETAMINOPHEN 325 MG PO TABS
650.0000 mg | ORAL_TABLET | Freq: Four times a day (QID) | ORAL | Status: DC | PRN
  Administered 2023-04-12 – 2023-04-15 (×4): 650 mg via ORAL
  Filled 2023-04-12 (×4): qty 2

## 2023-04-12 NOTE — Progress Notes (Signed)
PROGRESS NOTE    Zachary Sullivan  ZOX:[id number] DOB: May 04, 1981 DOA: 04/11/2023 PCP: Patient, No Pcp Per    Brief Narrative:   Zachary Sullivan is a 42 y.o. incarcerated male with past medical history significant for gout, GERD, tobacco use disorder who presented from jail with right facial swelling.  Patient noted onset days prior to admission.  Believes suspected insect bite.  Area has continued to progress with surrounding erythema, swelling and pain.  In the ED, temperature 98.0 F, HR 80, RR 18, BP 128/85, SpO2 99% on room air.  WBC 8.0, hemoglobin 15.0, platelets 249.  Sodium 139, potassium 3.7, chloride 102, CO2 26, BUN 16, creatinine 0.80.  CT maxillofacial with contrast with soft tissue stranding infraorbital and premaxillary soft tissues on the right consistent with preseptal cellulitis, small hypodense region subcu soft tissues measuring 4 x 6 mm which could represent small abscess, no evidence of postseptal inflammatory changes to suggest orbital cellulitis.  Patient started on IV antibiotics.  Patient concerned about jail's ability to manage this infection.  EDP consulted [facility] for admission for further evaluation management of preseptal cellulitis.  Assessment & Plan:   Preseptal cellulitis Patient presenting with 4-day history of progressive swelling, erythema and tenderness to his right cheek.  Ports recent bug bite potentially.  Patient is afebrile without leukocytosis. -- Vancomycin, pharmacy consulted for dosing/monitoring -- Zosyn 3.375 g IV every 8 hours -- Toradol 50 mg IV every 6 hours as needed severe pain  Hx gout No concern for acute issue   DVT prophylaxis: enoxaparin (LOVENOX) injection 40 mg Start: 04/11/23 2230    Code Status: Full Code Family Communication: No family present at bedside, while enforcement present  Disposition Plan:  Level of care: Med-Surg Status is: Inpatient Remains inpatient appropriate because: IV antibiotics  Consultants:   None  Procedures:  None  Antimicrobials:  Vancomycin 6/4>> Zosyn 6/4>>   Subjective: Patient seen examined bedside, resting comfortably.  Lying in bed sleeping but easily arousable; shackles in place.  Law Engineer, manufacturing systems present at bedside.  Continues with tenderness to right maxillofacial region.  Reports some visual disturbance due to swelling.  Remains on IV antibiotics.  Discussed CT findings with patient.  Remains afebrile, no leukocytosis.  No other questions or concerns at this time.  Denies headache, no dizziness,  Objective: Vitals:   04/12/23 0114 04/12/23 0556 04/12/23 0940 04/12/23 1129  BP: 110/70 110/74 (!) 98/57 (!) 105/55  Pulse: 85 73 85 81  Resp: 18 18 16 14   Temp: 97.9 F (36.6 C) 98.4 F (36.9 C) 98.5 F (36.9 C)   TempSrc: Oral Oral Oral   SpO2: 99% 98% 98% 95%  Weight:      Height:        Intake/Output Summary (Last 24 hours) at 04/12/2023 1324 Last data filed at 04/12/2023 1000 Gross per 24 hour  Intake 1910.09 ml  Output --  Net 1910.09 ml   Filed Weights   04/11/23 1559  Weight: 81.6 kg    Examination:  Physical Exam: GEN: NAD, alert and oriented x 3, wd/wn HEENT: NCAT, PERRL, EOMI, sclera clear, MMM, right maxillofacial region with edema, erythema, tenderness to palpation PULM: CTAB w/o wheezes/crackles, normal respiratory effort CV: RRR w/o M/G/R GI: abd soft, NTND, NABS, no R/G/M MSK: no peripheral edema, muscle strength globally intact 5/5 bilateral upper/lower extremities NEURO: CN II-XII intact, no focal deficits, sensation to light touch intact PSYCH: normal mood/affect Integumentary: Right maxillofacial region as above, otherwise no other concerning rashes/lesions/wounds  noted on exposed skin surfaces.      Data Reviewed: I have personally reviewed following labs and imaging studies  CBC: Recent Labs  Lab 04/11/23 1800 04/12/23 0844  WBC 8.0 7.5  NEUTROABS 5.2  --   HGB 15.0 13.2  HCT 44.7 38.0*  MCV 93.9 92.2   PLT 249 216   Basic Metabolic Panel: Recent Labs  Lab 04/11/23 1800 04/12/23 0844  NA 139 138  K 3.7 3.7  CL 102 104  CO2 26 23  GLUCOSE 92 170*  BUN 16 18  CREATININE 0.80 0.96  CALCIUM 9.4 8.8*   GFR: Estimated Creatinine Clearance: 108.9 mL/min (by C-G formula based on SCr of 0.96 mg/dL). Liver Function Tests: Recent Labs  Lab 04/12/23 0844  AST 26  ALT 25  ALKPHOS 56  BILITOT 0.7  PROT 6.9  ALBUMIN 3.5   No results for input(s): "LIPASE", "AMYLASE" in the last 168 hours. No results for input(s): "AMMONIA" in the last 168 hours. Coagulation Profile: No results for input(s): "INR", "PROTIME" in the last 168 hours. Cardiac Enzymes: No results for input(s): "CKTOTAL", "CKMB", "CKMBINDEX", "TROPONINI" in the last 168 hours. BNP (last 3 results) No results for input(s): "PROBNP" in the last 8760 hours. HbA1C: No results for input(s): "HGBA1C" in the last 72 hours. CBG: No results for input(s): "GLUCAP" in the last 168 hours. Lipid Profile: No results for input(s): "CHOL", "HDL", "LDLCALC", "TRIG", "CHOLHDL", "LDLDIRECT" in the last 72 hours. Thyroid Function Tests: No results for input(s): "TSH", "T4TOTAL", "FREET4", "T3FREE", "THYROIDAB" in the last 72 hours. Anemia Panel: No results for input(s): "VITAMINB12", "FOLATE", "FERRITIN", "TIBC", "IRON", "RETICCTPCT" in the last 72 hours. Sepsis Labs: No results for input(s): "PROCALCITON", "LATICACIDVEN" in the last 168 hours.  No results found for this or any previous visit (from the past 240 hour(s)).       Radiology Studies: CT Maxillofacial W Contrast  Result Date: 04/11/2023 CLINICAL DATA:  right facial celluitis extending to eye EXAM: CT MAXILLOFACIAL WITH CONTRAST TECHNIQUE: Multidetector CT imaging of the maxillofacial structures was performed with intravenous contrast. Multiplanar CT image reconstructions were also generated. RADIATION DOSE REDUCTION: This exam was performed according to the departmental  dose-optimization program which includes automated exposure control, adjustment of the mA and/or kV according to patient size and/or use of iterative reconstruction technique. CONTRAST:  75mL OMNIPAQUE IOHEXOL 300 MG/ML  SOLN COMPARISON:  None Available. FINDINGS: Osseous: No fracture or mandibular dislocation. No destructive process. Orbits: Negative. No traumatic or inflammatory finding. Sinuses: No middle ear or mastoid effusion. Impacted cerumen in the left EAC. Paranasal sinuses are clear. Soft tissues: Soft tissue stranding in the infraorbital and pre maxillary soft tissues on the right. No evidence of a drainable fluid collection. There is a small hypodense region in the subcutaneous soft tissues measuring approximately 4 x 6 mm, which could represent a small abscess (series 3, image 61). Limited intracranial: No significant or unexpected finding. IMPRESSION: Soft tissue stranding in the infraorbital and pre maxillary soft tissues on the right, consistent with preseptal cellulitis. There is a small hypodense region in the subcutaneous soft tissues measuring approximately 4 x 6 mm, which could represent a small abscess. No evidence of postseptal inflammatory changes to suggest orbital cellulitis. Electronically Signed   By: Lorenza Cambridge M.D.   On: 04/11/2023 19:33        Scheduled Meds:  enoxaparin (LOVENOX) injection  40 mg Subcutaneous Q24H   Continuous Infusions:  piperacillin-tazobactam (ZOSYN)  IV 3.375 g (04/12/23 0435)   [  START ON 04/13/2023] vancomycin       LOS: 1 day    Time spent: 52 minutes spent on chart review, discussion with nursing staff, consultants, updating family and interview/physical exam; more than 50% of that time was spent in counseling and/or coordination of care.    Alvira Philips [place], DO [facility] Available via Epic secure chat 7am-7pm After these hours, please refer to coverage provider listed on [email or web address] 04/12/2023, 1:24 PM

## 2023-04-12 NOTE — Progress Notes (Signed)
Pharmacy Antibiotic Note  Zachary Sullivan is a 42 y.o. male admitted on 04/11/2023 with cellulitis, right cheek up to right lower eyelid.  Pharmacy has been consulted for Zosyn and vancomycin dosing.  In ED, Zosyn 3.375 g IV x 1 and vancomycin 1750 mg IV x 1   Plan: -Continue Zosyn 3.375g IV q8h (4 hour infusion). -Adjust Vancomycin dose to 1.25gm IV every 12 hours (Goal AUC 400-550, eAUC 448) -Monitor clinical progress, renal function, vancomycin levels as indicated F/U C&S, abx deescalation / LOT   Height: 5\' 11"  (180.3 cm) Weight: 81.6 kg (179 lb 14.3 oz) IBW/kg (Calculated) : 75.3  Temp (24hrs), Avg:98.1 F (36.7 C), Min:97.9 F (36.6 C), Max:98.5 F (36.9 C)  Recent Labs  Lab 04/11/23 1800 04/12/23 0844  WBC 8.0 7.5  CREATININE 0.80 0.96     Estimated Creatinine Clearance: 108.9 mL/min (by C-G formula based on SCr of 0.96 mg/dL).    No Known Allergies  Antimicrobials this admission: 6/4 Zosyn >>  6/4 vancomycin >>   Dose adjustments this admission: 6/5: change vancomycin from 1.75gm IV q 12hrs to 1.25gm q 12hrs  No Microbiology data   Thank you for allowing pharmacy to be a part of this patient's care.  Josefa Half, PharmD 04/12/2023 12:07 PM

## 2023-04-13 DIAGNOSIS — L03213 Periorbital cellulitis: Secondary | ICD-10-CM | POA: Diagnosis not present

## 2023-04-13 LAB — HIV ANTIBODY (ROUTINE TESTING W REFLEX): HIV Screen 4th Generation wRfx: NONREACTIVE

## 2023-04-13 NOTE — Progress Notes (Signed)
PROGRESS NOTE    BRAZOS HUEBNER  WUJ:[id number] DOB: Mar 24, 1981 DOA: 04/11/2023 PCP: Patient, No Pcp Per    Brief Narrative:   Zachary Sullivan is a 42 y.o. incarcerated male with past medical history significant for gout, GERD, tobacco use disorder who presented from jail with right facial swelling.  Patient noted onset days prior to admission.  Believes suspected insect bite.  Area has continued to progress with surrounding erythema, swelling and pain.  In the ED, temperature 98.0 F, HR 80, RR 18, BP 128/85, SpO2 99% on room air.  WBC 8.0, hemoglobin 15.0, platelets 249.  Sodium 139, potassium 3.7, chloride 102, CO2 26, BUN 16, creatinine 0.80.  CT maxillofacial with contrast with soft tissue stranding infraorbital and premaxillary soft tissues on the right consistent with preseptal cellulitis, small hypodense region subcu soft tissues measuring 4 x 6 mm which could represent small abscess, no evidence of postseptal inflammatory changes to suggest orbital cellulitis.  Patient started on IV antibiotics.  Patient concerned about jail's ability to manage this infection.  EDP consulted [facility] for admission for further evaluation management of preseptal cellulitis.  Assessment & Plan:   Preseptal cellulitis Patient presenting with 4-day history of progressive swelling, erythema and tenderness to his right cheek. Question of recent bug bite.  Patient is afebrile without leukocytosis.   CT maxillofacial with contrast with soft tissue stranding infraorbital and premaxillary soft tissues on the right consistent with preseptal cellulitis, small hypodense region subcu soft tissues measuring 4 x 6 mm which could represent small abscess, no evidence of postseptal inflammatory changes to suggest orbital cellulitis. -- Vancomycin, pharmacy consulted for dosing/monitoring -- Zosyn 3.375 g IV every 8 hours -- Toradol 50 mg IV every 6 hours as needed severe pain -- Consider repeat CT maxillofacial tomorrow if no  significant improvement  Hx gout No concern for acute issue   DVT prophylaxis: enoxaparin (LOVENOX) injection 40 mg Start: 04/11/23 2230    Code Status: Full Code Family Communication: No family present at bedside, while enforcement present  Disposition Plan:  Level of care: Med-Surg Status is: Inpatient Remains inpatient appropriate because: IV antibiotics  Consultants:  None  Procedures:  None  Antimicrobials:  Vancomycin 6/4>> Zosyn 6/4>>   Subjective: Patient seen examined bedside, resting comfortably.  Lying in bed sleeping but easily arousable; shackles in place.  Law Engineer, manufacturing systems present at bedside.  Continues with tenderness to right maxillofacial region; feels like it is "puffier".    Remains on IV antibiotics.  Discussed will repeat CT face tomorrow if no significant improvement or worsening swelling.  Remains afebrile, no leukocytosis.  No other questions or concerns at this time.  Denies headache, no dizziness, no fever/chills/night sweats, no nausea/vomiting/diarrhea, no chest pain, no palpitations, no abdominal pain.  No acute events overnight per nursing staff.  Objective: Vitals:   04/12/23 1355 04/12/23 1747 04/12/23 2054 04/13/23 0548  BP: 122/72 126/78 135/81 114/74  Pulse: 85 80 90 71  Resp: 16 14 18 18   Temp: 98.6 F (37 C) 98.4 F (36.9 C) 98.3 F (36.8 C) 97.6 F (36.4 C)  TempSrc: Oral Oral Oral Oral  SpO2: 100% 97% 97% 97%  Weight:      Height:        Intake/Output Summary (Last 24 hours) at 04/13/2023 1057 Last data filed at 04/13/2023 1000 Gross per 24 hour  Intake 2308.91 ml  Output 0 ml  Net 2308.91 ml   Filed Weights   04/11/23 1559  Weight: 81.6 kg  Examination:  Physical Exam: GEN: NAD, alert and oriented x 3, wd/wn HEENT: NCAT, PERRL, EOMI, sclera clear, MMM, right maxillofacial region with edema, erythema, tenderness to palpation PULM: CTAB w/o wheezes/crackles, normal respiratory effort CV: RRR w/o M/G/R GI: abd  soft, NTND, NABS, no R/G/M MSK: no peripheral edema, muscle strength globally intact 5/5 bilateral upper/lower extremities NEURO: CN II-XII intact, no focal deficits, sensation to light touch intact PSYCH: normal mood/affect Integumentary: Right maxillofacial region as above, otherwise no other concerning rashes/lesions/wounds noted on exposed skin surfaces.      Data Reviewed: I have personally reviewed following labs and imaging studies  CBC: Recent Labs  Lab 04/11/23 1800 04/12/23 0844  WBC 8.0 7.5  NEUTROABS 5.2  --   HGB 15.0 13.2  HCT 44.7 38.0*  MCV 93.9 92.2  PLT 249 216   Basic Metabolic Panel: Recent Labs  Lab 04/11/23 1800 04/12/23 0844  NA 139 138  K 3.7 3.7  CL 102 104  CO2 26 23  GLUCOSE 92 170*  BUN 16 18  CREATININE 0.80 0.96  CALCIUM 9.4 8.8*   GFR: Estimated Creatinine Clearance: 108.9 mL/min (by C-G formula based on SCr of 0.96 mg/dL). Liver Function Tests: Recent Labs  Lab 04/12/23 0844  AST 26  ALT 25  ALKPHOS 56  BILITOT 0.7  PROT 6.9  ALBUMIN 3.5   No results for input(s): "LIPASE", "AMYLASE" in the last 168 hours. No results for input(s): "AMMONIA" in the last 168 hours. Coagulation Profile: No results for input(s): "INR", "PROTIME" in the last 168 hours. Cardiac Enzymes: No results for input(s): "CKTOTAL", "CKMB", "CKMBINDEX", "TROPONINI" in the last 168 hours. BNP (last 3 results) No results for input(s): "PROBNP" in the last 8760 hours. HbA1C: No results for input(s): "HGBA1C" in the last 72 hours. CBG: No results for input(s): "GLUCAP" in the last 168 hours. Lipid Profile: No results for input(s): "CHOL", "HDL", "LDLCALC", "TRIG", "CHOLHDL", "LDLDIRECT" in the last 72 hours. Thyroid Function Tests: No results for input(s): "TSH", "T4TOTAL", "FREET4", "T3FREE", "THYROIDAB" in the last 72 hours. Anemia Panel: No results for input(s): "VITAMINB12", "FOLATE", "FERRITIN", "TIBC", "IRON", "RETICCTPCT" in the last 72  hours. Sepsis Labs: No results for input(s): "PROCALCITON", "LATICACIDVEN" in the last 168 hours.  No results found for this or any previous visit (from the past 240 hour(s)).       Radiology Studies: CT Maxillofacial W Contrast  Result Date: 04/11/2023 CLINICAL DATA:  right facial celluitis extending to eye EXAM: CT MAXILLOFACIAL WITH CONTRAST TECHNIQUE: Multidetector CT imaging of the maxillofacial structures was performed with intravenous contrast. Multiplanar CT image reconstructions were also generated. RADIATION DOSE REDUCTION: This exam was performed according to the departmental dose-optimization program which includes automated exposure control, adjustment of the mA and/or kV according to patient size and/or use of iterative reconstruction technique. CONTRAST:  75mL OMNIPAQUE IOHEXOL 300 MG/ML  SOLN COMPARISON:  None Available. FINDINGS: Osseous: No fracture or mandibular dislocation. No destructive process. Orbits: Negative. No traumatic or inflammatory finding. Sinuses: No middle ear or mastoid effusion. Impacted cerumen in the left EAC. Paranasal sinuses are clear. Soft tissues: Soft tissue stranding in the infraorbital and pre maxillary soft tissues on the right. No evidence of a drainable fluid collection. There is a small hypodense region in the subcutaneous soft tissues measuring approximately 4 x 6 mm, which could represent a small abscess (series 3, image 61). Limited intracranial: No significant or unexpected finding. IMPRESSION: Soft tissue stranding in the infraorbital and pre maxillary soft tissues on the  right, consistent with preseptal cellulitis. There is a small hypodense region in the subcutaneous soft tissues measuring approximately 4 x 6 mm, which could represent a small abscess. No evidence of postseptal inflammatory changes to suggest orbital cellulitis. Electronically Signed   By: Lorenza Cambridge M.D.   On: 04/11/2023 19:33        Scheduled Meds:  enoxaparin (LOVENOX)  injection  40 mg Subcutaneous Q24H   Continuous Infusions:  piperacillin-tazobactam (ZOSYN)  IV 3.375 g (04/13/23 0520)   vancomycin 1,250 mg (04/13/23 0012)     LOS: 2 days    Time spent: 52 minutes spent on chart review, discussion with nursing staff, consultants, updating family and interview/physical exam; more than 50% of that time was spent in counseling and/or coordination of care.    Alvira Philips [place], DO [facility] Available via Epic secure chat 7am-7pm After these hours, please refer to coverage provider listed on [email or web address] 04/13/2023, 10:57 AM

## 2023-04-13 NOTE — TOC CM/SW Note (Signed)
Transition of Care [facility]) - Inpatient Brief Assessment   Patient Details  Name: Zachary Sullivan MRN: [id number] Date of Birth: 1981/06/29  [facility] of [facility] CM/SW Contact:    Amada Jupiter, LCSW Phone Number: 04/13/2023, 12:53 PM   Clinical Narrative:  Pt currently in custody of [facility] and will return to jail at dc.  No TOC needs at this time.   Transition of Care Asessment: Insurance and Status: Insurance coverage has been reviewed (coverage through [facility]) Patient has primary care physician: No (currently in sheriff custody) Home environment has been reviewed: in custody Prior level of function:: independent Prior/Current Home Services: No current home services Social Determinants of Health Reivew: SDOH reviewed no interventions necessary Readmission risk has been reviewed: Yes Transition of care needs: no transition of care needs at this time

## 2023-04-14 ENCOUNTER — Inpatient Hospital Stay (HOSPITAL_COMMUNITY)

## 2023-04-14 DIAGNOSIS — L03213 Periorbital cellulitis: Secondary | ICD-10-CM | POA: Diagnosis not present

## 2023-04-14 LAB — CREATININE, SERUM
Creatinine, Ser: 0.8 mg/dL (ref 0.61–1.24)
GFR, Estimated: >60 mL/min (ref >60)

## 2023-04-14 MED ORDER — ALUM & MAG HYDROXIDE-SIMETH 200-200-20 MG/5ML PO SUSP
30.0000 mL | Freq: Four times a day (QID) | ORAL | Status: DC | PRN
  Administered 2023-04-14 – 2023-04-18 (×4): 30 mL via ORAL
  Filled 2023-04-14 (×3): qty 30

## 2023-04-14 MED ORDER — VANCOMYCIN HCL 1500 MG/300ML IV SOLN
1500.0000 mg | Freq: Two times a day (BID) | INTRAVENOUS | Status: DC
  Administered 2023-04-14 – 2023-04-15 (×4): 1500 mg via INTRAVENOUS
  Filled 2023-04-14 (×4): qty 300

## 2023-04-14 MED ORDER — IOHEXOL 300 MG/ML  SOLN
75.0000 mL | Freq: Once | INTRAMUSCULAR | Status: AC | PRN
  Administered 2023-04-14: 75 mL via INTRAVENOUS

## 2023-04-14 MED ORDER — HYDROMORPHONE HCL 1 MG/ML IJ SOLN
1.0000 mg | INTRAMUSCULAR | Status: AC
  Administered 2023-04-14: 1 mg via INTRAVENOUS
  Filled 2023-04-14: qty 1

## 2023-04-14 NOTE — Progress Notes (Signed)
Pharmacy Antibiotic Note  Zachary Sullivan is a 42 y.o. male admitted on 04/11/2023 with preseptal cellulitis.  Pharmacy has been consulted for Zosyn and Vancomycin dosing.  In ED, Zosyn 3.375 g IV x 1 and vancomycin 1750 mg IV x 1   Plan: -Continue Zosyn 3.375g IV q8h (4 hour infusion). -Adjust Vancomycin dose to 1.5gm IV every 12 hours (Goal AUC 400-550, eAUC 452) -Monitor clinical progress, renal function, vancomycin levels as indicated F/U C&S, abx deescalation / LOT   Height: 5\' 11"  (180.3 cm) Weight: 81.6 kg (179 lb 14.3 oz) IBW/kg (Calculated) : 75.3  Temp (24hrs), Avg:98.2 F (36.8 C), Min:97.7 F (36.5 C), Max:98.6 F (37 C)  Recent Labs  Lab 04/11/23 1800 04/12/23 0844 04/14/23 0530  WBC 8.0 7.5  --   CREATININE 0.80 0.96 0.80     Estimated Creatinine Clearance: 130.7 mL/min (by C-G formula based on SCr of 0.8 mg/dL).    No Known Allergies  Antimicrobials this admission: 6/4 Zosyn >>  6/4 vancomycin >>   Dose adjustments this admission: 6/5: change vancomycin from 1.75gm IV q 12hrs to 1.25gm q 12hrs 6/7: changed vancomycin from 1.25gm IV q 12hrs to 1.5gm q 12hrs  No Microbiology data   Thank you for allowing pharmacy to be a part of this patient's care.  Josefa Half, PharmD 04/14/2023 11:00 AM

## 2023-04-14 NOTE — Progress Notes (Signed)
PROGRESS NOTE    STEELE ZIMMERLY  GMW:[id number] DOB: [birth year] DOA: 04/11/2023 PCP: Patient, No Pcp Per    Brief Narrative:   Zachary Sullivan is a 42 y.o. incarcerated male with past medical history significant for gout, GERD, tobacco use disorder who presented from jail with right facial swelling.  Patient noted onset days prior to admission.  Believes suspected insect bite.  Area has continued to progress with surrounding erythema, swelling and pain.  In the ED, temperature 98.0 F, HR 80, RR 18, BP 128/85, SpO2 99% on room air.  WBC 8.0, hemoglobin 15.0, platelets 249.  Sodium 139, potassium 3.7, chloride 102, CO2 26, BUN 16, creatinine 0.80.  CT maxillofacial with contrast with soft tissue stranding infraorbital and premaxillary soft tissues on the right consistent with preseptal cellulitis, small hypodense region subcu soft tissues measuring 4 x 6 mm which could represent small abscess, no evidence of postseptal inflammatory changes to suggest orbital cellulitis.  Patient started on IV antibiotics.  Patient concerned about jail's ability to manage this infection.  EDP consulted [facility] for admission for further evaluation management of preseptal cellulitis.  Assessment & Plan:   Preseptal cellulitis Patient presenting with 4-day history of progressive swelling, erythema and tenderness to his right cheek. Question of recent bug bite.  Patient is afebrile without leukocytosis.   CT maxillofacial with contrast with soft tissue stranding infraorbital and premaxillary soft tissues on the right consistent with preseptal cellulitis, small hypodense region subcu soft tissues measuring 4 x 6 mm which could represent small abscess, no evidence of postseptal inflammatory changes to suggest orbital cellulitis. -- Vancomycin, pharmacy consulted for dosing/monitoring -- Zosyn 3.375 g IV every 8 hours -- Toradol 50 mg IV every 6 hours as needed severe pain -- Will repeat CT maxillofacial today  Hx gout No  concern for acute issue   DVT prophylaxis: enoxaparin (LOVENOX) injection 40 mg Start: 04/11/23 2230    Code Status: Full Code Family Communication: No family present at bedside, Sheriff's deputy present  Disposition Plan:  Level of care: Med-Surg Status is: Inpatient Remains inpatient appropriate because: IV antibiotics; repeating CT maxillofacial today  Consultants:  None  Procedures:  None  Antimicrobials:  Vancomycin 6/4>> Zosyn 6/4>>   Subjective: Patient seen examined bedside, resting comfortably.  Lying in bed.  Reports increasing size of his lesion to his right face today.  Discussed with we will repeat imaging today as may likely be developing an abscess that needs to be drained.   Remains on IV antibiotics.  Remains afebrile.  No other questions or concerns at this time.  Denies headache, no dizziness, no fever/chills/night sweats, no nausea/vomiting/diarrhea, no chest pain, no palpitations, no abdominal pain.  No acute events overnight per nursing staff.  Objective: Vitals:   04/13/23 0548 04/13/23 1341 04/13/23 2103 04/14/23 0536  BP: 114/74 131/84 (!) 144/88 106/62  Pulse: 71 85 77 72  Resp: 18 20 18 18   Temp: 97.6 F (36.4 C) 98.6 F (37 C) 98.4 F (36.9 C) 97.7 F (36.5 C)  TempSrc: Oral Oral Oral Oral  SpO2: 97% 97% 98% 98%  Weight:      Height:        Intake/Output Summary (Last 24 hours) at 04/14/2023 1201 Last data filed at 04/14/2023 1150 Gross per 24 hour  Intake 1523.7 ml  Output 400 ml  Net 1123.7 ml   Filed Weights   04/11/23 1559  Weight: 81.6 kg    Examination:  Physical Exam: GEN: NAD, alert and  oriented x 3, wd/wn HEENT: NCAT, PERRL, EOMI, sclera clear, MMM, right maxillofacial region with edema, erythema, tenderness to palpation PULM: CTAB w/o wheezes/crackles, normal respiratory effort CV: RRR w/o M/G/R GI: abd soft, NTND, NABS, no R/G/M MSK: no peripheral edema, muscle strength globally intact 5/5 bilateral upper/lower  extremities NEURO: CN II-XII intact, no focal deficits, sensation to light touch intact PSYCH: normal mood/affect Integumentary: Right maxillofacial region as above, otherwise no other concerning rashes/lesions/wounds noted on exposed skin surfaces.    04/13/2023    04/14/2023  04/14/2023    Data Reviewed: I have personally reviewed following labs and imaging studies  CBC: Recent Labs  Lab 04/11/23 1800 04/12/23 0844  WBC 8.0 7.5  NEUTROABS 5.2  --   HGB 15.0 13.2  HCT 44.7 38.0*  MCV 93.9 92.2  PLT 249 216   Basic Metabolic Panel: Recent Labs  Lab 04/11/23 1800 04/12/23 0844 04/14/23 0530  NA 139 138  --   K 3.7 3.7  --   CL 102 104  --   CO2 26 23  --   GLUCOSE 92 170*  --   BUN 16 18  --   CREATININE 0.80 0.96 0.80  CALCIUM 9.4 8.8*  --    GFR: Estimated Creatinine Clearance: 130.7 mL/min (by C-G formula based on SCr of 0.8 mg/dL). Liver Function Tests: Recent Labs  Lab 04/12/23 0844  AST 26  ALT 25  ALKPHOS 56  BILITOT 0.7  PROT 6.9  ALBUMIN 3.5   No results for input(s): "LIPASE", "AMYLASE" in the last 168 hours. No results for input(s): "AMMONIA" in the last 168 hours. Coagulation Profile: No results for input(s): "INR", "PROTIME" in the last 168 hours. Cardiac Enzymes: No results for input(s): "CKTOTAL", "CKMB", "CKMBINDEX", "TROPONINI" in the last 168 hours. BNP (last 3 results) No results for input(s): "PROBNP" in the last 8760 hours. HbA1C: No results for input(s): "HGBA1C" in the last 72 hours. CBG: No results for input(s): "GLUCAP" in the last 168 hours. Lipid Profile: No results for input(s): "CHOL", "HDL", "LDLCALC", "TRIG", "CHOLHDL", "LDLDIRECT" in the last 72 hours. Thyroid Function Tests: No results for input(s): "TSH", "T4TOTAL", "FREET4", "T3FREE", "THYROIDAB" in the last 72 hours. Anemia Panel: No results for input(s): "VITAMINB12", "FOLATE", "FERRITIN", "TIBC", "IRON", "RETICCTPCT" in the last 72 hours. Sepsis Labs: No  results for input(s): "PROCALCITON", "LATICACIDVEN" in the last 168 hours.  No results found for this or any previous visit (from the past 240 hour(s)).       Radiology Studies: No results found.      Scheduled Meds:  enoxaparin (LOVENOX) injection  40 mg Subcutaneous Q24H   Continuous Infusions:  piperacillin-tazobactam (ZOSYN)  IV 3.375 g (04/14/23 0523)   vancomycin 1,500 mg (04/14/23 1149)     LOS: 3 days    Time spent: 52 minutes spent on chart review, discussion with nursing staff, consultants, updating family and interview/physical exam; more than 50% of that time was spent in counseling and/or coordination of care.    Alvira Philips [place], DO [facility] Available via Epic secure chat 7am-7pm After these hours, please refer to coverage provider listed on [email or web address] 04/14/2023, 12:01 PM

## 2023-04-15 DIAGNOSIS — L03213 Periorbital cellulitis: Secondary | ICD-10-CM | POA: Diagnosis not present

## 2023-04-15 LAB — CBC
HCT: 37.8 % — ABNORMAL LOW (ref 39.0–52.0)
Hemoglobin: 12.6 g/dL — ABNORMAL LOW (ref 13.0–17.0)
MCH: 32 pg (ref 26.0–34.0)
MCHC: 33.3 g/dL (ref 30.0–36.0)
MCV: 95.9 fL (ref 80.0–100.0)
Platelets: 207 10*3/uL (ref 150–400)
RBC: 3.94 MIL/uL — ABNORMAL LOW (ref 4.22–5.81)
RDW: 13.2 % (ref 11.5–15.5)
WBC: 5.7 10*3/uL (ref 4.0–10.5)
nRBC: 0 % (ref 0.0–0.2)

## 2023-04-15 LAB — BASIC METABOLIC PANEL
Anion gap: 11 (ref 5–15)
BUN: 20 mg/dL (ref 6–20)
CO2: 23 mmol/L (ref 22–32)
Calcium: 8.7 mg/dL — ABNORMAL LOW (ref 8.9–10.3)
Chloride: 103 mmol/L (ref 98–111)
Creatinine, Ser: 0.89 mg/dL (ref 0.61–1.24)
GFR, Estimated: >60 mL/min (ref >60)
Glucose, Bld: 94 mg/dL (ref 70–99)
Potassium: 3.7 mmol/L (ref 3.5–5.1)
Sodium: 137 mmol/L (ref 135–145)

## 2023-04-15 MED ORDER — HYDROMORPHONE HCL 1 MG/ML IJ SOLN
1.0000 mg | INTRAMUSCULAR | Status: AC
  Administered 2023-04-15: 1 mg via INTRAVENOUS
  Filled 2023-04-15: qty 1

## 2023-04-15 MED ORDER — SODIUM CHLORIDE 0.9 % IV SOLN
INTRAVENOUS | Status: DC | PRN
  Administered 2023-04-15: 10 mL/h via INTRAVENOUS

## 2023-04-15 NOTE — Progress Notes (Signed)
PROGRESS NOTE    Zachary Sullivan  BJY:[id number] DOB: 20-Mar-1981 DOA: 04/11/2023 PCP: Patient, No Pcp Per    Brief Narrative:   Zachary Sullivan is a 42 y.o. incarcerated male with past medical history significant for gout, GERD, tobacco use disorder who presented from jail with right facial swelling.  Patient noted onset days prior to admission.  Believes suspected insect bite.  Area has continued to progress with surrounding erythema, swelling and pain.  In the ED, temperature 98.0 F, HR 80, RR 18, BP 128/85, SpO2 99% on room air.  WBC 8.0, hemoglobin 15.0, platelets 249.  Sodium 139, potassium 3.7, chloride 102, CO2 26, BUN 16, creatinine 0.80.  CT maxillofacial with contrast with soft tissue stranding infraorbital and premaxillary soft tissues on the right consistent with preseptal cellulitis, small hypodense region subcu soft tissues measuring 4 x 6 mm which could represent small abscess, no evidence of postseptal inflammatory changes to suggest orbital cellulitis.  Patient started on IV antibiotics.  Patient concerned about jail's ability to manage this infection.  EDP consulted [facility] for admission for further evaluation management of preseptal cellulitis.  Assessment & Plan:   Preseptal cellulitis Patient presenting with 4-day history of progressive swelling, erythema and tenderness to his right cheek. Question of recent bug bite.  Patient is afebrile without leukocytosis.   CT maxillofacial with contrast with soft tissue stranding infraorbital and premaxillary soft tissues on the right consistent with preseptal cellulitis, small hypodense region subcu soft tissues measuring 4 x 6 mm which could represent small abscess, no evidence of postseptal inflammatory changes to suggest orbital cellulitis.  Repeat CT maxillofacial with contrast 6/7 with soft tissue edema, thickening infraorbital right cheek compatible with cellulitis/phlegmon likely not well-defined enough to be drained at this time. --  Vancomycin, pharmacy consulted for dosing/monitoring -- Zosyn 3.375 g IV every 8 hours -- Toradol 50 mg IV every 6 hours as needed severe pain  Hx gout No concern for acute issue   DVT prophylaxis: enoxaparin (LOVENOX) injection 40 mg Start: 04/11/23 2230    Code Status: Full Code Family Communication: No family present at bedside, Sheriff's deputy present  Disposition Plan:  Level of care: Med-Surg Status is: Inpatient Remains inpatient appropriate because: IV antibiotics; repeating CT maxillofacial today  Consultants:  None  Procedures:  None  Antimicrobials:  Vancomycin 6/4>> Zosyn 6/4>>   Subjective: Patient seen examined bedside, resting comfortably.  Lying in bed.  Slightly decreased pain/pressure to his right face.  Discussed repeat CT findings with patient this morning.  Will attempt further decompression of his phlegmon later this morning.  Discussed with RN.  No other questions or concerns at this time.  Denies headache, no dizziness, no fever/chills/night sweats, no nausea/vomiting/diarrhea, no chest pain, no palpitations, no abdominal pain.  No acute events overnight per nursing staff.  Objective: Vitals:   04/14/23 0536 04/14/23 1343 04/14/23 2052 04/15/23 0538  BP: 106/62 135/84 (!) 140/85 126/79  Pulse: 72 82 83 69  Resp: 18 18 17 17   Temp: 97.7 F (36.5 C) 98.3 F (36.8 C) 98.4 F (36.9 C) 97.7 F (36.5 C)  TempSrc: Oral Oral Oral Oral  SpO2: 98% 97% 97% 98%  Weight:      Height:        Intake/Output Summary (Last 24 hours) at 04/15/2023 1059 Last data filed at 04/15/2023 1000 Gross per 24 hour  Intake 2246.81 ml  Output 2400 ml  Net -153.19 ml   Filed Weights   04/11/23 1559  Weight:  81.6 kg    Examination:  Physical Exam: GEN: NAD, alert and oriented x 3, wd/wn HEENT: NCAT, PERRL, EOMI, sclera clear, MMM, right maxillofacial region with edema, erythema, tenderness to palpation PULM: CTAB w/o wheezes/crackles, normal respiratory  effort CV: RRR w/o M/G/R GI: abd soft, NTND, NABS, no R/G/M MSK: no peripheral edema, muscle strength globally intact 5/5 bilateral upper/lower extremities NEURO: CN II-XII intact, no focal deficits, sensation to light touch intact PSYCH: normal mood/affect Integumentary: Right maxillofacial region as above, otherwise no other concerning rashes/lesions/wounds noted on exposed skin surfaces.    04/13/2023    04/14/2023  04/14/2023    Data Reviewed: I have personally reviewed following labs and imaging studies  CBC: Recent Labs  Lab 04/11/23 1800 04/12/23 0844 04/15/23 0514  WBC 8.0 7.5 5.7  NEUTROABS 5.2  --   --   HGB 15.0 13.2 12.6*  HCT 44.7 38.0* 37.8*  MCV 93.9 92.2 95.9  PLT 249 216 207   Basic Metabolic Panel: Recent Labs  Lab 04/11/23 1800 04/12/23 0844 04/14/23 0530 04/15/23 0514  NA 139 138  --  137  K 3.7 3.7  --  3.7  CL 102 104  --  103  CO2 26 23  --  23  GLUCOSE 92 170*  --  94  BUN 16 18  --  20  CREATININE 0.80 0.96 0.80 0.89  CALCIUM 9.4 8.8*  --  8.7*   GFR: Estimated Creatinine Clearance: 117.5 mL/min (by C-G formula based on SCr of 0.89 mg/dL). Liver Function Tests: Recent Labs  Lab 04/12/23 0844  AST 26  ALT 25  ALKPHOS 56  BILITOT 0.7  PROT 6.9  ALBUMIN 3.5   No results for input(s): "LIPASE", "AMYLASE" in the last 168 hours. No results for input(s): "AMMONIA" in the last 168 hours. Coagulation Profile: No results for input(s): "INR", "PROTIME" in the last 168 hours. Cardiac Enzymes: No results for input(s): "CKTOTAL", "CKMB", "CKMBINDEX", "TROPONINI" in the last 168 hours. BNP (last 3 results) No results for input(s): "PROBNP" in the last 8760 hours. HbA1C: No results for input(s): "HGBA1C" in the last 72 hours. CBG: No results for input(s): "GLUCAP" in the last 168 hours. Lipid Profile: No results for input(s): "CHOL", "HDL", "LDLCALC", "TRIG", "CHOLHDL", "LDLDIRECT" in the last 72 hours. Thyroid Function Tests: No results  for input(s): "TSH", "T4TOTAL", "FREET4", "T3FREE", "THYROIDAB" in the last 72 hours. Anemia Panel: No results for input(s): "VITAMINB12", "FOLATE", "FERRITIN", "TIBC", "IRON", "RETICCTPCT" in the last 72 hours. Sepsis Labs: No results for input(s): "PROCALCITON", "LATICACIDVEN" in the last 168 hours.  No results found for this or any previous visit (from the past 240 hour(s)).       Radiology Studies: CT MAXILLOFACIAL W CONTRAST  Result Date: 04/14/2023 CLINICAL DATA:  Nasal abscess progressive edema/induration right maxofacial region EXAM: CT MAXILLOFACIAL WITH CONTRAST TECHNIQUE: Multidetector CT imaging of the maxillofacial structures was performed with intravenous contrast. Multiplanar CT image reconstructions were also generated. RADIATION DOSE REDUCTION: This exam was performed according to the departmental dose-optimization program which includes automated exposure control, adjustment of the mA and/or kV according to patient size and/or use of iterative reconstruction technique. CONTRAST:  75mL OMNIPAQUE IOHEXOL 300 MG/ML  SOLN COMPARISON:  None Available. FINDINGS: Osseous: No fracture or mandibular dislocation. No destructive process. Orbits: Negative. No traumatic or inflammatory finding. Sinuses: Largely clear. Soft tissues: Soft tissue edema and thickening along the right infraorbital cheek, compatible with cellulitis/phlegmon. Ill-defined hypodense region. Limited intracranial: No significant or unexpected finding. IMPRESSION: Soft  tissue edema and thickening along the infraorbital right cheek, compatible with cellulitis/phlegmon. Ill-defined hypodense region is suggestive of phlegmon/early abscess, but this region probably is not well defined enough to be drained at this time. Electronically Signed   By: Feliberto Harts M.D.   On: 04/14/2023 17:29        Scheduled Meds:  enoxaparin (LOVENOX) injection  40 mg Subcutaneous Q24H    HYDROmorphone (DILAUDID) injection  1 mg  Intravenous NOW   Continuous Infusions:  piperacillin-tazobactam (ZOSYN)  IV 3.375 g (04/15/23 0523)   vancomycin 1,500 mg (04/15/23 0039)     LOS: 4 days    Time spent: 52 minutes spent on chart review, discussion with nursing staff, consultants, updating family and interview/physical exam; more than 50% of that time was spent in counseling and/or coordination of care.    Alvira Philips [place], DO [facility] Available via Epic secure chat 7am-7pm After these hours, please refer to coverage provider listed on [email or web address] 04/15/2023, 10:59 AM

## 2023-04-15 NOTE — Progress Notes (Signed)
Applied saline wet/dry guaze to pt's L cheek wound. Pt found punching various buttons on his IV pump, pt instructed do not mess w IV pump and front panel locked.

## 2023-04-16 DIAGNOSIS — L03213 Periorbital cellulitis: Secondary | ICD-10-CM | POA: Diagnosis not present

## 2023-04-16 MED ORDER — LINEZOLID 600 MG PO TABS
600.0000 mg | ORAL_TABLET | Freq: Two times a day (BID) | ORAL | Status: DC
  Administered 2023-04-16 – 2023-04-18 (×5): 600 mg via ORAL
  Filled 2023-04-16 (×5): qty 1

## 2023-04-16 NOTE — Progress Notes (Signed)
PROGRESS NOTE    Zachary Sullivan  ZOX:[id number] DOB: Apr 03, 1981 DOA: 04/11/2023 PCP: Patient, No Pcp Per    Brief Narrative:   Zachary Sullivan is a 42 y.o. incarcerated male with past medical history significant for gout, GERD, tobacco use disorder who presented from jail with right facial swelling.  Patient noted onset days prior to admission.  Believes suspected insect bite.  Area has continued to progress with surrounding erythema, swelling and pain.  In the ED, temperature 98.0 F, HR 80, RR 18, BP 128/85, SpO2 99% on room air.  WBC 8.0, hemoglobin 15.0, platelets 249.  Sodium 139, potassium 3.7, chloride 102, CO2 26, BUN 16, creatinine 0.80.  CT maxillofacial with contrast with soft tissue stranding infraorbital and premaxillary soft tissues on the right consistent with preseptal cellulitis, small hypodense region subcu soft tissues measuring 4 x 6 mm which could represent small abscess, no evidence of postseptal inflammatory changes to suggest orbital cellulitis.  Patient started on IV antibiotics.  Patient concerned about jail's ability to manage this infection.  EDP consulted [facility] for admission for further evaluation management of preseptal cellulitis.  Assessment & Plan:   Preseptal cellulitis Patient presenting with 4-day history of progressive swelling, erythema and tenderness to his right cheek. Question of recent bug bite.  Patient is afebrile without leukocytosis.   CT maxillofacial with contrast with soft tissue stranding infraorbital and premaxillary soft tissues on the right consistent with preseptal cellulitis, small hypodense region subcu soft tissues measuring 4 x 6 mm which could represent small abscess, no evidence of postseptal inflammatory changes to suggest orbital cellulitis.  Repeat CT maxillofacial with contrast 6/7 with soft tissue edema, thickening infraorbital right cheek compatible with cellulitis/phlegmon likely not well-defined enough to be drained at this time. --  Linezolid 600 mg p.o. twice daily -- Zosyn 3.375 g IV every 8 hours -- Toradol 50 mg IV every 6 hours as needed severe pain -- Likely discharge back to jail on 6/10 to complete antibiotic course with likely Zyvox/Augmentin  Hx gout No concern for acute issue   DVT prophylaxis: enoxaparin (LOVENOX) injection 40 mg Start: 04/11/23 2230    Code Status: Full Code Family Communication: No family present at bedside, Sheriff's deputy present  Disposition Plan:  Level of care: Med-Surg Status is: Inpatient Remains inpatient appropriate because: IV antibiotics; anticipate discharge back to jail tomorrow  Consultants:  None  Procedures:  None  Antimicrobials:  Vancomycin 6/4 - 6/8 Linezolid 6/9>> Zosyn 6/4>>   Subjective: Patient seen examined bedside, resting comfortably.  Lying in bed.  Sleeping but easy arousable.  Decreased erythema with reduced pain/pressure to the right side of his face.  No other questions or concerns at this time.  Denies headache, no dizziness, no fever/chills/night sweats, no nausea/vomiting/diarrhea, no chest pain, no palpitations, no abdominal pain.  No acute events overnight per nursing staff.  Anticipate discharge back to jail tomorrow with continued oral antibiotics to complete course.  Objective: Vitals:   04/15/23 0538 04/15/23 1311 04/15/23 2109 04/16/23 0550  BP: 126/79 122/80 117/79 (!) 95/58  Pulse: 69 75 80 64  Resp: 17 16 18 16   Temp: 97.7 F (36.5 C) 98.3 F (36.8 C) 97.9 F (36.6 C) (!) 97.4 F (36.3 C)  TempSrc: Oral Oral Oral Oral  SpO2: 98% 96% 98% 97%  Weight:      Height:        Intake/Output Summary (Last 24 hours) at 04/16/2023 1214 Last data filed at 04/16/2023 1000 Gross per 24  hour  Intake 2867.12 ml  Output 3850 ml  Net -982.88 ml   Filed Weights   04/11/23 1559  Weight: 81.6 kg    Examination:  Physical Exam: GEN: NAD, alert and oriented x 3, wd/wn HEENT: NCAT, PERRL, EOMI, sclera clear, MMM, right  maxillofacial region with edema, erythema, tenderness to palpation PULM: CTAB w/o wheezes/crackles, normal respiratory effort CV: RRR w/o M/G/R GI: abd soft, NTND, NABS, no R/G/M MSK: no peripheral edema, muscle strength globally intact 5/5 bilateral upper/lower extremities NEURO: CN II-XII intact, no focal deficits, sensation to light touch intact PSYCH: normal mood/affect Integumentary: Right maxillofacial region as above, otherwise no other concerning rashes/lesions/wounds noted on exposed skin surfaces.    04/13/2023    04/14/2023  04/14/2023    Data Reviewed: I have personally reviewed following labs and imaging studies  CBC: Recent Labs  Lab 04/11/23 1800 04/12/23 0844 04/15/23 0514  WBC 8.0 7.5 5.7  NEUTROABS 5.2  --   --   HGB 15.0 13.2 12.6*  HCT 44.7 38.0* 37.8*  MCV 93.9 92.2 95.9  PLT 249 216 207   Basic Metabolic Panel: Recent Labs  Lab 04/11/23 1800 04/12/23 0844 04/14/23 0530 04/15/23 0514  NA 139 138  --  137  K 3.7 3.7  --  3.7  CL 102 104  --  103  CO2 26 23  --  23  GLUCOSE 92 170*  --  94  BUN 16 18  --  20  CREATININE 0.80 0.96 0.80 0.89  CALCIUM 9.4 8.8*  --  8.7*   GFR: Estimated Creatinine Clearance: 117.5 mL/min (by C-G formula based on SCr of 0.89 mg/dL). Liver Function Tests: Recent Labs  Lab 04/12/23 0844  AST 26  ALT 25  ALKPHOS 56  BILITOT 0.7  PROT 6.9  ALBUMIN 3.5   No results for input(s): "LIPASE", "AMYLASE" in the last 168 hours. No results for input(s): "AMMONIA" in the last 168 hours. Coagulation Profile: No results for input(s): "INR", "PROTIME" in the last 168 hours. Cardiac Enzymes: No results for input(s): "CKTOTAL", "CKMB", "CKMBINDEX", "TROPONINI" in the last 168 hours. BNP (last 3 results) No results for input(s): "PROBNP" in the last 8760 hours. HbA1C: No results for input(s): "HGBA1C" in the last 72 hours. CBG: No results for input(s): "GLUCAP" in the last 168 hours. Lipid Profile: No results for  input(s): "CHOL", "HDL", "LDLCALC", "TRIG", "CHOLHDL", "LDLDIRECT" in the last 72 hours. Thyroid Function Tests: No results for input(s): "TSH", "T4TOTAL", "FREET4", "T3FREE", "THYROIDAB" in the last 72 hours. Anemia Panel: No results for input(s): "VITAMINB12", "FOLATE", "FERRITIN", "TIBC", "IRON", "RETICCTPCT" in the last 72 hours. Sepsis Labs: No results for input(s): "PROCALCITON", "LATICACIDVEN" in the last 168 hours.  No results found for this or any previous visit (from the past 240 hour(s)).       Radiology Studies: CT MAXILLOFACIAL W CONTRAST  Result Date: 04/14/2023 CLINICAL DATA:  Nasal abscess progressive edema/induration right maxofacial region EXAM: CT MAXILLOFACIAL WITH CONTRAST TECHNIQUE: Multidetector CT imaging of the maxillofacial structures was performed with intravenous contrast. Multiplanar CT image reconstructions were also generated. RADIATION DOSE REDUCTION: This exam was performed according to the departmental dose-optimization program which includes automated exposure control, adjustment of the mA and/or kV according to patient size and/or use of iterative reconstruction technique. CONTRAST:  75mL OMNIPAQUE IOHEXOL 300 MG/ML  SOLN COMPARISON:  None Available. FINDINGS: Osseous: No fracture or mandibular dislocation. No destructive process. Orbits: Negative. No traumatic or inflammatory finding. Sinuses: Largely clear. Soft tissues: Soft tissue  edema and thickening along the right infraorbital cheek, compatible with cellulitis/phlegmon. Ill-defined hypodense region. Limited intracranial: No significant or unexpected finding. IMPRESSION: Soft tissue edema and thickening along the infraorbital right cheek, compatible with cellulitis/phlegmon. Ill-defined hypodense region is suggestive of phlegmon/early abscess, but this region probably is not well defined enough to be drained at this time. Electronically Signed   By: Feliberto Harts M.D.   On: 04/14/2023 17:29         Scheduled Meds:  enoxaparin (LOVENOX) injection  40 mg Subcutaneous Q24H   linezolid  600 mg Oral Q12H   Continuous Infusions:  sodium chloride 10 mL/hr (04/15/23 1440)   piperacillin-tazobactam (ZOSYN)  IV 3.375 g (04/16/23 0514)     LOS: 5 days    Time spent: 46 minutes spent on chart review, discussion with nursing staff, consultants, updating family and interview/physical exam; more than 50% of that time was spent in counseling and/or coordination of care.    Alvira Philips [place], DO [facility] Available via Epic secure chat 7am-7pm After these hours, please refer to coverage provider listed on [email or web address] 04/16/2023, 12:14 PM

## 2023-04-17 ENCOUNTER — Inpatient Hospital Stay (HOSPITAL_COMMUNITY)

## 2023-04-17 DIAGNOSIS — L03213 Periorbital cellulitis: Secondary | ICD-10-CM | POA: Diagnosis not present

## 2023-04-17 LAB — BASIC METABOLIC PANEL
Anion gap: 12 (ref 5–15)
BUN: 25 mg/dL — ABNORMAL HIGH (ref 6–20)
CO2: 24 mmol/L (ref 22–32)
Calcium: 9.5 mg/dL (ref 8.9–10.3)
Chloride: 103 mmol/L (ref 98–111)
Creatinine, Ser: 1.12 mg/dL (ref 0.61–1.24)
GFR, Estimated: >60 mL/min (ref >60)
Glucose, Bld: 96 mg/dL (ref 70–99)
Potassium: 4 mmol/L (ref 3.5–5.1)
Sodium: 139 mmol/L (ref 135–145)

## 2023-04-17 LAB — CBC
HCT: 44.4 % (ref 39.0–52.0)
Hemoglobin: 14.9 g/dL (ref 13.0–17.0)
MCH: 31.5 pg (ref 26.0–34.0)
MCHC: 33.6 g/dL (ref 30.0–36.0)
MCV: 93.9 fL (ref 80.0–100.0)
Platelets: 266 10*3/uL (ref 150–400)
RBC: 4.73 MIL/uL (ref 4.22–5.81)
RDW: 13.3 % (ref 11.5–15.5)
WBC: 6.6 10*3/uL (ref 4.0–10.5)
nRBC: 0 % (ref 0.0–0.2)

## 2023-04-17 MED ORDER — SODIUM CHLORIDE (PF) 0.9 % IJ SOLN
INTRAMUSCULAR | Status: AC
  Filled 2023-04-17: qty 50

## 2023-04-17 MED ORDER — IOHEXOL 300 MG/ML  SOLN
75.0000 mL | Freq: Once | INTRAMUSCULAR | Status: AC | PRN
  Administered 2023-04-17: 75 mL via INTRAVENOUS

## 2023-04-17 NOTE — Progress Notes (Signed)
  Progress Note   Patient: Zachary Sullivan JXB:[id number] DOB: 11/27/1980 DOA: 04/11/2023     6 DOS: the patient was seen and examined on 04/17/2023   Brief hospital course: 42yo male with h/o gout, GERD, and tobacco dependence who presented from jail on 6/4 with R facial swelling, preseptal cellulitis.  He is being treated with linezolid and Zosyn.    Assessment and Plan:  Preseptal cellulitis -Patient presented with 4-day history of progressive swelling, erythema and tenderness to his right cheek. -CT maxillofacial with contrast with soft tissue stranding infraorbital and premaxillary soft tissues on the right consistent with preseptal cellulitis, small hypodense region subcu soft tissues measuring 4 x 6 mm which could represent small abscess, no evidence of postseptal inflammatory changes to suggest orbital cellulitis.   -Repeat CT maxillofacial with contrast 6/7 with soft tissue edema, thickening infraorbital right cheek compatible with cellulitis/phlegmon likely not well-defined enough to be drained at this time. -Exam today with increased density of the region, concerning for developing abscess; repeat CT ordered -Continue Linezolid 600 mg p.o. twice daily -Continue Zosyn 3.375 g IV every 8 hours -Continue Toradol 50 mg IV every 6 hours as needed severe pain -Likely discharge back to jail on 6/11 to complete antibiotic course with likely Zyvox/Augmentin if drainage is not needed   Hx gout -No concern for acute issue    Subjective: He continues to feel tightness and is noticing drainage from his wounds.  No fever.  Physical Exam: Vitals:   04/16/23 0550 04/16/23 1327 04/16/23 2021 04/17/23 0612  BP: (!) 95/58 114/82 126/86 102/69  Pulse: 64 79 80 70  Resp: 16 14 17 18   Temp: (!) 97.4 F (36.3 C) 98.1 F (36.7 C) 98.1 F (36.7 C) 97.7 F (36.5 C)  TempSrc: Oral Oral Oral Oral  SpO2: 97% 97% 98% 96%  Weight:      Height:       General:  Appears calm and comfortable and is in  NAD Eyes:  EOMI, normal lids, iris ENT:  R maxillary region with significant fluctuance, persistent drainage from wounds, mild surrounding erythema   Neck:  no LAD, masses or thyromegaly Cardiovascular:  RRR, no m/r/g. No LE edema.  Respiratory:   CTA bilaterally with no wheezes/rales/rhonchi.  Normal respiratory effort. Abdomen:  soft, NT, ND Skin:  small area of maculopapular dermatitis adjacent to left labial fold without apparent flutuance Musculoskeletal:  grossly normal tone BUE/BLE, good ROM, no bony abnormality Psychiatric:  blunted mood and affect, speech fluent and appropriate, AOx3 Neurologic:  CN 2-12 grossly intact, moves all extremities in coordinated fashion   Radiological Exams on Admission: Independently reviewed - see discussion in A/P where applicable  No results found.  EKG: none   Labs on Admission: I have personally reviewed the available labs and imaging studies at the time of the admission.  Pertinent labs:     Unremarkable BMP Normal CBC   Family Communication: Presenting from jail, officer present throughout  Disposition: Status is: Inpatient Remains inpatient appropriate because: concern for developing abscess  Planned Discharge Destination:  Jail    Time spent: 50 minutes  Author: Jonah Blue, MD 04/17/2023 7:44 AM  For on call review [email or web address].

## 2023-04-17 NOTE — Progress Notes (Signed)
Pharmacy Antibiotic Note  Zachary Sullivan is a 42 y.o. male admitted on 04/11/2023 with preseptal cellulitis.  Pharmacy has been consulted for Zosyn and Vancomycin dosing.  In ED, Zosyn 3.375 g IV x 1 and vancomycin 1750 mg IV x 1   Plan: -Continue Zosyn 3.375g IV q8h (4 hour infusion). - Zyvox 600 mg IV q12h  -Monitor clinical progress, renal function, vancomycin levels as indicated F/U C&S, abx deescalation / LOT   Height: 5\' 11"  (180.3 cm) Weight: 81.6 kg (179 lb 14.3 oz) IBW/kg (Calculated) : 75.3  Temp (24hrs), Avg:98 F (36.7 C), Min:97.7 F (36.5 C), Max:98.1 F (36.7 C)  Recent Labs  Lab 04/11/23 1800 04/12/23 0844 04/14/23 0530 04/15/23 0514 04/17/23 0443  WBC 8.0 7.5  --  5.7 6.6  CREATININE 0.80 0.96 0.80 0.89 1.12     Estimated Creatinine Clearance: 93.4 mL/min (by C-G formula based on SCr of 1.12 mg/dL).    No Known Allergies    Dosage  will likely remain stable at above dosage and need for further dosage adjustment appears unlikely at present.    Will sign off at this time.  Please reconsult if a change in clinical status warrants re-evaluation of dosage.    No Microbiology data   Thank you for allowing pharmacy to be a part of this patient's care.   Adalberto Cole, PharmD, BCPS 04/17/2023 12:08 PM

## 2023-04-17 NOTE — Hospital Course (Signed)
42yo male with h/o gout, GERD, and tobacco dependence who presented from jail on 6/4 with R facial swelling, preseptal cellulitis.  He is being treated with linezolid and Zosyn.

## 2023-04-18 ENCOUNTER — Other Ambulatory Visit (HOSPITAL_COMMUNITY): Payer: Self-pay

## 2023-04-18 DIAGNOSIS — L03213 Periorbital cellulitis: Secondary | ICD-10-CM | POA: Diagnosis not present

## 2023-04-18 LAB — BASIC METABOLIC PANEL
Anion gap: 9 (ref 5–15)
BUN: 25 mg/dL — ABNORMAL HIGH (ref 6–20)
CO2: 24 mmol/L (ref 22–32)
Calcium: 9.6 mg/dL (ref 8.9–10.3)
Chloride: 103 mmol/L (ref 98–111)
Creatinine, Ser: 0.8 mg/dL (ref 0.61–1.24)
GFR, Estimated: >60 mL/min (ref >60)
Glucose, Bld: 101 mg/dL — ABNORMAL HIGH (ref 70–99)
Potassium: 3.7 mmol/L (ref 3.5–5.1)
Sodium: 136 mmol/L (ref 135–145)

## 2023-04-18 LAB — CBC WITH DIFFERENTIAL/PLATELET
Abs Immature Granulocytes: 0.11 10*3/uL — ABNORMAL HIGH (ref 0.00–0.07)
Basophils Absolute: 0.1 10*3/uL (ref 0.0–0.1)
Basophils Relative: 1 %
Eosinophils Absolute: 0.4 10*3/uL (ref 0.0–0.5)
Eosinophils Relative: 5 %
HCT: 42.4 % (ref 39.0–52.0)
Hemoglobin: 14.2 g/dL (ref 13.0–17.0)
Immature Granulocytes: 2 %
Lymphocytes Relative: 37 %
Lymphs Abs: 2.7 10*3/uL (ref 0.7–4.0)
MCH: 31.5 pg (ref 26.0–34.0)
MCHC: 33.5 g/dL (ref 30.0–36.0)
MCV: 94 fL (ref 80.0–100.0)
Monocytes Absolute: 0.7 10*3/uL (ref 0.1–1.0)
Monocytes Relative: 10 %
Neutro Abs: 3.4 10*3/uL (ref 1.7–7.7)
Neutrophils Relative %: 45 %
Platelets: 268 10*3/uL (ref 150–400)
RBC: 4.51 MIL/uL (ref 4.22–5.81)
RDW: 13.4 % (ref 11.5–15.5)
WBC: 7.3 10*3/uL (ref 4.0–10.5)
nRBC: 0 % (ref 0.0–0.2)

## 2023-04-18 MED ORDER — AMOXICILLIN-POT CLAVULANATE 875-125 MG PO TABS
1.0000 | ORAL_TABLET | Freq: Two times a day (BID) | ORAL | 0 refills | Status: AC
  Filled 2023-04-18: qty 6, 3d supply, fill #0

## 2023-04-18 MED ORDER — LINEZOLID 600 MG PO TABS
600.0000 mg | ORAL_TABLET | Freq: Two times a day (BID) | ORAL | 0 refills | Status: AC
  Filled 2023-04-18: qty 6, 3d supply, fill #0

## 2023-04-18 NOTE — Progress Notes (Signed)
Patient was given discharge instructions, and all questions were answered. Patient was stable for discharge and was taken to the ambulance bay where he was picked up by a police car to take him back to jail.

## 2023-04-18 NOTE — Discharge Summary (Signed)
Physician Discharge Summary   Patient: Zachary Sullivan MRN: [id number] DOB: 1981-03-04  Admit date:     04/11/2023  Discharge date: 04/18/23  Discharge Physician: Jonah Blue   PCP: Patient, No Pcp Per   Recommendations at discharge:   Complete antibiotics as prescribed Work hard with your substance abuse recovery, avoid relapses  Discharge Diagnoses: Principal Problem:   Preseptal cellulitis    Hospital Course: 42yo male with h/o gout, GERD, and tobacco dependence who presented from jail on 6/4 with R facial swelling, preseptal cellulitis.  He is being treated with linezolid and Zosyn.    Assessment and Plan:  Preseptal cellulitis -Patient presented with 4-day history of progressive swelling, erythema and tenderness to his right cheek. -CT maxillofacial with contrast with soft tissue stranding infraorbital and premaxillary soft tissues on the right consistent with preseptal cellulitis, small hypodense region subcu soft tissues measuring 4 x 6 mm which could represent small abscess, no evidence of postseptal inflammatory changes to suggest orbital cellulitis.   -Repeat CT maxillofacial with contrast 6/7 with soft tissue edema, thickening infraorbital right cheek compatible with cellulitis/phlegmon likely not well-defined enough to be drained at this time. -Repeat CT on 6/10 with improving cellulitis and decreased size of abscess with no organized collection -He appears appropriate for dc back to jail today -Will need to complete antibiotic course with Zyvox/Augmentin for total of 10 days (through 6/14)   Hx gout -No concern for acute issue  Polysubstance abuse -he reports that he has a few more weeks in jail and then is going to [facility] in [place] -He has been through a difficult time but wants to get better so that he can help to care for his 6yo daughter     Pain control - [employer] Controlled Substance Reporting System database was reviewed. and patient was  instructed, not to drive, operate heavy machinery, perform activities at heights, swimming or participation in water activities or provide baby-sitting services while on Pain, Sleep and Anxiety Medications; until their outpatient Physician has advised to do so again. Also recommended to not to take more than prescribed Pain, Sleep and Anxiety Medications.   Consultants: TOC team Procedures performed: None  Disposition:  Jail Diet recommendation:  Regular diet DISCHARGE MEDICATION: Allergies as of 04/18/2023   No Known Allergies      Medication List     STOP taking these medications    ondansetron 4 MG disintegrating tablet Commonly known as: ZOFRAN-ODT       TAKE these medications    acetaminophen 325 MG tablet Commonly known as: TYLENOL Take 325 mg by mouth in the morning and at bedtime.   amoxicillin-clavulanate 875-125 MG tablet Commonly known as: AUGMENTIN Take 1 tablet by mouth 2 (two) times daily for 3 days.   ibuprofen 600 MG tablet Commonly known as: ADVIL Take 600 mg by mouth in the morning and at bedtime.   linezolid 600 MG tablet Commonly known as: ZYVOX Take 1 tablet (600 mg total) by mouth every 12 (twelve) hours for 3 days.   loratadine 10 MG tablet Commonly known as: CLARITIN Take 10 mg by mouth in the morning.   pantoprazole 20 MG tablet Commonly known as: PROTONIX Take 20 mg by mouth daily before breakfast.               Discharge Care Instructions  (From admission, onward)           Start     Ordered   04/18/23 0000  Discharge  wound care:       Comments: Keep wound clean and dry, apply covering of petroleum jelly and bandage   04/18/23 1135            Discharge Exam: Filed Weights   04/11/23 1559  Weight: 81.6 kg   Subjective:  He is feeling ok and agrees with plan for dc.  His wife died after bacterial endocarditis and he went through a difficult time with substance abuse but is trying to get better and planning to go  to rehab post-jail.   General:  Appears calm and comfortable and is in NAD; shackled to bed Eyes:  PERRL, EOMI, normal lids, iris ENT:  grossly normal hearing, lips & tongue, mmm; R face with improving wounds and surrounding erythema Neck:  no LAD, masses or thyromegaly Cardiovascular:  RRR, no m/r/g. No LE edema.  Respiratory:   CTA bilaterally with no wheezes/rales/rhonchi.  Normal respiratory effort. Abdomen:  soft, NT, ND Skin:  no rash or induration seen on limited exam Musculoskeletal:  grossly normal tone BUE/BLE, good ROM, no bony abnormality Psychiatric:  grossly normal mood and affect, speech fluent and appropriate, AOx3 Neurologic:  CN 2-12 grossly intact, moves all extremities in coordinated fashion   Pertinent labs:    Unremarkable BMP Normal CBC   Condition at discharge: improving  The results of significant diagnostics from this hospitalization (including imaging, microbiology, ancillary and laboratory) are listed below for reference.   Imaging Studies: CT MAXILLOFACIAL W CONTRAST  Result Date: 04/17/2023 CLINICAL DATA:  Right facial swelling, preseptal cellulitis. EXAM: CT MAXILLOFACIAL WITH CONTRAST TECHNIQUE: Multidetector CT imaging of the maxillofacial structures was performed with intravenous contrast. Multiplanar CT image reconstructions were also generated. RADIATION DOSE REDUCTION: This exam was performed according to the departmental dose-optimization program which includes automated exposure control, adjustment of the mA and/or kV according to patient size and/or use of iterative reconstruction technique. CONTRAST:  75mL OMNIPAQUE IOHEXOL 300 MG/ML  SOLN COMPARISON:  CT facial bones 04/14/2023 FINDINGS: Osseous: There is no acute osseous abnormality or suspicious osseous lesion. Orbits: The globes are intact. There is no stranding in the infraorbital fat on either side. The extraocular muscles are normal. The optic nerves are normal. Sinuses: There is partial  opacification of the left posterior ethmoid air cells. The other paranasal sinuses are clear. The mastoid air cells and middle ear cavities are clear. Soft tissues: Inflammatory change in the soft tissues of the right face and infraorbital region is again seen, improved since the prior study. The ill-defined area of hypodensity in the infraorbital soft tissues is decreased in size and conspicuity compared to the prior study. There is no organized fluid collection on the current study. A punctate metallic density foreign body overlying the right sternocleidomastoid muscle is unchanged. Limited intracranial: Unremarkable. IMPRESSION: Inflammatory change in the right face and infraorbital region is improved since 04/14/2023 consistent with improving cellulitis. The ill-defined area of hypodensity in the infraorbital soft tissues seen on the prior study which likely reflected phlegmon/early abscess is decreased in size and conspicuity. No organized fluid collection or postseptal inflammatory change identified on the current study. Electronically Signed   By: Lesia Hausen M.D.   On: 04/17/2023 17:08   CT MAXILLOFACIAL W CONTRAST  Result Date: 04/14/2023 CLINICAL DATA:  Nasal abscess progressive edema/induration right maxofacial region EXAM: CT MAXILLOFACIAL WITH CONTRAST TECHNIQUE: Multidetector CT imaging of the maxillofacial structures was performed with intravenous contrast. Multiplanar CT image reconstructions were also generated. RADIATION DOSE REDUCTION: This exam was  performed according to the departmental dose-optimization program which includes automated exposure control, adjustment of the mA and/or kV according to patient size and/or use of iterative reconstruction technique. CONTRAST:  75mL OMNIPAQUE IOHEXOL 300 MG/ML  SOLN COMPARISON:  None Available. FINDINGS: Osseous: No fracture or mandibular dislocation. No destructive process. Orbits: Negative. No traumatic or inflammatory finding. Sinuses: Largely  clear. Soft tissues: Soft tissue edema and thickening along the right infraorbital cheek, compatible with cellulitis/phlegmon. Ill-defined hypodense region. Limited intracranial: No significant or unexpected finding. IMPRESSION: Soft tissue edema and thickening along the infraorbital right cheek, compatible with cellulitis/phlegmon. Ill-defined hypodense region is suggestive of phlegmon/early abscess, but this region probably is not well defined enough to be drained at this time. Electronically Signed   By: Feliberto Harts M.D.   On: 04/14/2023 17:29   CT Maxillofacial W Contrast  Result Date: 04/11/2023 CLINICAL DATA:  right facial celluitis extending to eye EXAM: CT MAXILLOFACIAL WITH CONTRAST TECHNIQUE: Multidetector CT imaging of the maxillofacial structures was performed with intravenous contrast. Multiplanar CT image reconstructions were also generated. RADIATION DOSE REDUCTION: This exam was performed according to the departmental dose-optimization program which includes automated exposure control, adjustment of the mA and/or kV according to patient size and/or use of iterative reconstruction technique. CONTRAST:  75mL OMNIPAQUE IOHEXOL 300 MG/ML  SOLN COMPARISON:  None Available. FINDINGS: Osseous: No fracture or mandibular dislocation. No destructive process. Orbits: Negative. No traumatic or inflammatory finding. Sinuses: No middle ear or mastoid effusion. Impacted cerumen in the left EAC. Paranasal sinuses are clear. Soft tissues: Soft tissue stranding in the infraorbital and pre maxillary soft tissues on the right. No evidence of a drainable fluid collection. There is a small hypodense region in the subcutaneous soft tissues measuring approximately 4 x 6 mm, which could represent a small abscess (series 3, image 61). Limited intracranial: No significant or unexpected finding. IMPRESSION: Soft tissue stranding in the infraorbital and pre maxillary soft tissues on the right, consistent with preseptal  cellulitis. There is a small hypodense region in the subcutaneous soft tissues measuring approximately 4 x 6 mm, which could represent a small abscess. No evidence of postseptal inflammatory changes to suggest orbital cellulitis. Electronically Signed   By: Lorenza Cambridge M.D.   On: 04/11/2023 19:33      Discharge time spent: greater than 30 minutes.  Signed: Jonah Blue, MD [facility] 04/18/2023
